# Patient Record
Sex: Female | Born: 1980 | Race: White | Hispanic: No | Marital: Single | Smoking: Current every day smoker
Health system: Southern US, Community
[De-identification: ages and names within clinical notes are randomized; demographics above are authoritative.]

## PROBLEM LIST (undated history)

## (undated) DIAGNOSIS — R531 Weakness: Secondary | ICD-10-CM

## (undated) DIAGNOSIS — F419 Anxiety disorder, unspecified: Secondary | ICD-10-CM

## (undated) DIAGNOSIS — M542 Cervicalgia: Secondary | ICD-10-CM

## (undated) DIAGNOSIS — J45909 Unspecified asthma, uncomplicated: Secondary | ICD-10-CM

## (undated) DIAGNOSIS — Z8709 Personal history of other diseases of the respiratory system: Secondary | ICD-10-CM

## (undated) DIAGNOSIS — J069 Acute upper respiratory infection, unspecified: Secondary | ICD-10-CM

## (undated) DIAGNOSIS — F1090 Alcohol use, unspecified, uncomplicated: Secondary | ICD-10-CM

## (undated) DIAGNOSIS — K219 Gastro-esophageal reflux disease without esophagitis: Secondary | ICD-10-CM

## (undated) DIAGNOSIS — I10 Essential (primary) hypertension: Secondary | ICD-10-CM

## (undated) DIAGNOSIS — F32A Depression, unspecified: Secondary | ICD-10-CM

## (undated) DIAGNOSIS — T7840XA Allergy, unspecified, initial encounter: Secondary | ICD-10-CM

## (undated) DIAGNOSIS — R1115 Cyclical vomiting syndrome unrelated to migraine: Secondary | ICD-10-CM

## (undated) HISTORY — PX: WISDOM TOOTH EXTRACTION: SHX21

## (undated) HISTORY — DX: Allergy, unspecified, initial encounter: T78.40XA

---
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
## 2007-09-30 ENCOUNTER — Emergency Department (HOSPITAL_COMMUNITY): Admission: EM | Admit: 2007-09-30 | Discharge: 2007-09-30 | Payer: Self-pay | Admitting: Family Medicine

## 2012-12-13 ENCOUNTER — Encounter (HOSPITAL_COMMUNITY): Payer: Self-pay | Admitting: *Deleted

## 2012-12-13 ENCOUNTER — Emergency Department (HOSPITAL_COMMUNITY)
Admission: EM | Admit: 2012-12-13 | Discharge: 2012-12-13 | Disposition: A | Discharge status: Home / Self Care | Payer: BC Managed Care – PPO | Attending: Emergency Medicine | Admitting: Emergency Medicine

## 2012-12-13 ENCOUNTER — Emergency Department (HOSPITAL_COMMUNITY): Payer: BC Managed Care – PPO

## 2012-12-13 DIAGNOSIS — R509 Fever, unspecified: Secondary | ICD-10-CM | POA: Insufficient documentation

## 2012-12-13 DIAGNOSIS — R05 Cough: Secondary | ICD-10-CM | POA: Insufficient documentation

## 2012-12-13 DIAGNOSIS — R51 Headache: Secondary | ICD-10-CM | POA: Insufficient documentation

## 2012-12-13 DIAGNOSIS — J45909 Unspecified asthma, uncomplicated: Secondary | ICD-10-CM | POA: Insufficient documentation

## 2012-12-13 DIAGNOSIS — R112 Nausea with vomiting, unspecified: Secondary | ICD-10-CM | POA: Insufficient documentation

## 2012-12-13 DIAGNOSIS — J029 Acute pharyngitis, unspecified: Secondary | ICD-10-CM | POA: Insufficient documentation

## 2012-12-13 DIAGNOSIS — R197 Diarrhea, unspecified: Secondary | ICD-10-CM | POA: Insufficient documentation

## 2012-12-13 DIAGNOSIS — F17200 Nicotine dependence, unspecified, uncomplicated: Secondary | ICD-10-CM | POA: Insufficient documentation

## 2012-12-13 DIAGNOSIS — R6889 Other general symptoms and signs: Secondary | ICD-10-CM

## 2012-12-13 DIAGNOSIS — R059 Cough, unspecified: Secondary | ICD-10-CM | POA: Insufficient documentation

## 2012-12-13 DIAGNOSIS — J3489 Other specified disorders of nose and nasal sinuses: Secondary | ICD-10-CM | POA: Insufficient documentation

## 2012-12-13 DIAGNOSIS — IMO0001 Reserved for inherently not codable concepts without codable children: Secondary | ICD-10-CM | POA: Insufficient documentation

## 2012-12-13 DIAGNOSIS — R61 Generalized hyperhidrosis: Secondary | ICD-10-CM | POA: Insufficient documentation

## 2012-12-13 HISTORY — DX: Unspecified asthma, uncomplicated: J45.909

## 2012-12-13 MED ORDER — SODIUM CHLORIDE 0.9 % IV BOLUS (SEPSIS)
1000.0000 mL | Freq: Once | INTRAVENOUS | Status: AC
  Administered 2012-12-13: 1000 mL via INTRAVENOUS

## 2012-12-13 MED ORDER — ONDANSETRON HCL 4 MG/2ML IJ SOLN
4.0000 mg | Freq: Once | INTRAMUSCULAR | Status: AC
  Administered 2012-12-13: 4 mg via INTRAVENOUS
  Filled 2012-12-13: qty 2

## 2012-12-13 MED ORDER — OSELTAMIVIR PHOSPHATE 75 MG PO CAPS: 75.0000 mg | ORAL_CAPSULE | Freq: Two times a day (BID) | ORAL | Status: DC

## 2012-12-13 MED ORDER — ONDANSETRON HCL 4 MG PO TABS: 4.0000 mg | ORAL_TABLET | Freq: Three times a day (TID) | ORAL | Status: DC | PRN

## 2012-12-13 NOTE — ED Provider Notes (Signed)
History   This chart was scribed for non-physician practitioner working with Meagan Mason. Meagan Payor, MD by Meagan Mason, ED Scribe. This patient was seen in room TR05C/TR05C and the patient's care was started at 9:50 PM.    CSN: [id number]  Arrival date & time 12/13/12  2101   First MD Initiated Contact with Patient 12/13/12 2117      Chief Complaint  Patient presents with  . Flu like symptoms      The history is provided by the patient. No language interpreter was used.   Meagan Mason is a 32 y.o. female with h/o asthma who presents to the Emergency Department complaining of 4 days of dry cough with 2 days of associated myalgias, sore throat, sinus pressure, chills, fever as high as 99.9 while taking medicine, nausea, non-bloody, non-bilious emesis, and non-bloody diarrhea.  Pt states cough has not been improved by Delsym.  Pt is a current everyday smoker and reports alcohol use.   Past Medical History  Diagnosis Date  . Asthma     Past Surgical History  Procedure Date  . Wisdom tooth extraction     History reviewed. No pertinent family history.  History  Substance Use Topics  . Smoking status: Current Every Day Smoker -- 0.5 packs/day  . Smokeless tobacco: Not on file  . Alcohol Use: Yes    No OB history provided.   Review of Systems  Constitutional: Positive for fever, chills and diaphoresis.  HENT: Positive for congestion and sinus pressure.   Respiratory: Positive for cough.   Gastrointestinal: Positive for nausea, vomiting and diarrhea. Negative for abdominal pain.    Allergies  Other  Home Medications  No current outpatient prescriptions on file.  BP 137/81  Pulse 112  Temp 99 F (37.2 C) (Oral)  Resp 22  SpO2 99%  LMP 11/27/2012  Physical Exam  Nursing note and vitals reviewed. Constitutional: She is oriented to person, place, and time. She appears well-developed and well-nourished. No distress.  HENT:  Head: Normocephalic and atraumatic.  Eyes:  EOM are normal.  Neck: Neck supple. No tracheal deviation present.  Cardiovascular: Regular rhythm and normal heart sounds.   No murmur heard.      Tachycardic.  Pulmonary/Chest: Effort normal and breath sounds normal. No respiratory distress. She has no wheezes.  Abdominal: She exhibits no distension.  Musculoskeletal: Normal range of motion.  Neurological: She is alert and oriented to person, place, and time.  Skin: Skin is warm and dry.  Psychiatric: She has a normal mood and affect. Her behavior is normal.    ED Course  Procedures (including critical care time) DIAGNOSTIC STUDIES: Oxygen Saturation is 99% on room air, normal by my interpretation.    COORDINATION OF CARE: 9:57 PM- Patient informed of clinical course, understands medical decision-making process, and agrees with plan.  Ordered IV fluids, IV Zofran, and chest XR.   Dg Chest 2 View  12/13/2012  *RADIOLOGY REPORT*  Clinical Data: Cough and body aches.  CHEST - 2 VIEW  Comparison: None.  Findings: Lungs clear.  Heart size normal.  No pneumothorax or pleural fluid.  Mild S-shaped scoliosis noted.  IMPRESSION: No acute disease.   Original Report Authenticated By: Holley Dexter, M.D.      1. Flu-like symptoms       MDM  Patient given Zofran and IV fluids. Patient feels moderately improved, x-ray of chest shows no acute process, will discharge the patient to home with specific return precautions. Will give the patient  Tamiflu and Zofran, and recommended Robitussin with codeine cough syrup, which can be obtained at the local pharmacy. Recommended hydrating with water/Gatorade mix. Patient understands and agrees with the plan. She is stable and ready for discharge.  Filed Vitals:   12/13/12 2315  BP: 127/77  Pulse: 92  Temp: 97.8 F (36.6 C)  Resp: 19     I personally performed the services described in this documentation, which was scribed in my presence. The recorded information has been reviewed and is  accurate.         Meagan Horseman, PA-C 12/13/12 2318

## 2012-12-13 NOTE — ED Notes (Signed)
Also c/o n/v/d.

## 2012-12-13 NOTE — ED Notes (Signed)
Pt c/o body aches, sore throat, cough, HA, and fever x 2 days.

## 2012-12-13 NOTE — ED Provider Notes (Signed)
Medical screening examination/treatment/procedure(s) were performed by non-physician practitioner and as supervising physician I was immediately available for consultation/collaboration.  Takisha Pelle R. Tyeisha Dinan, MD 12/13/12 2335 

## 2013-12-06 DIAGNOSIS — R531 Weakness: Secondary | ICD-10-CM

## 2013-12-06 DIAGNOSIS — M542 Cervicalgia: Secondary | ICD-10-CM

## 2013-12-06 HISTORY — DX: Cervicalgia: M54.2

## 2013-12-06 HISTORY — DX: Weakness: R53.1

## 2014-11-18 ENCOUNTER — Other Ambulatory Visit (HOSPITAL_COMMUNITY): Payer: Self-pay | Admitting: Neurosurgery

## 2014-12-03 ENCOUNTER — Encounter (HOSPITAL_COMMUNITY): Payer: Self-pay

## 2014-12-03 ENCOUNTER — Encounter (HOSPITAL_COMMUNITY)
Admission: RE | Admit: 2014-12-03 | Discharge: 2014-12-03 | Disposition: A | Discharge status: Home / Self Care | Payer: BC Managed Care – PPO | Source: Ambulatory Visit | Attending: Neurosurgery | Admitting: Neurosurgery

## 2014-12-03 DIAGNOSIS — Z01812 Encounter for preprocedural laboratory examination: Secondary | ICD-10-CM | POA: Insufficient documentation

## 2014-12-03 HISTORY — DX: Personal history of other diseases of the respiratory system: Z87.09

## 2014-12-03 HISTORY — DX: Acute upper respiratory infection, unspecified: J06.9

## 2014-12-03 HISTORY — DX: Cervicalgia: M54.2

## 2014-12-03 HISTORY — DX: Weakness: R53.1

## 2014-12-03 LAB — CBC
HEMATOCRIT: 41.4 % (ref 36.0–46.0)
HEMOGLOBIN: 13.8 g/dL (ref 12.0–15.0)
MCH: 32.5 pg (ref 26.0–34.0)
MCHC: 33.3 g/dL (ref 30.0–36.0)
MCV: 97.4 fL (ref 78.0–100.0)
Platelets: 250 10*3/uL (ref 150–400)
RBC: 4.25 MIL/uL (ref 3.87–5.11)
RDW: 12.9 % (ref 11.5–15.5)
WBC: 6.5 10*3/uL (ref 4.0–10.5)

## 2014-12-03 LAB — BASIC METABOLIC PANEL
ANION GAP: 7 (ref 5–15)
BUN: 15 mg/dL (ref 6–23)
CO2: 25 mmol/L (ref 19–32)
Calcium: 8.9 mg/dL (ref 8.4–10.5)
Chloride: 105 mEq/L (ref 96–112)
Creatinine, Ser: 0.6 mg/dL (ref 0.50–1.10)
GFR calc non Af Amer: >90 mL/min (ref >90)
GLUCOSE: 94 mg/dL (ref 70–99)
Potassium: 4.4 mmol/L (ref 3.5–5.1)
Sodium: 137 mmol/L (ref 135–145)

## 2014-12-03 LAB — SURGICAL PCR SCREEN
MRSA, PCR: NEGATIVE
Staphylococcus aureus: NEGATIVE

## 2014-12-03 LAB — HCG, SERUM, QUALITATIVE: Preg, Serum: NEGATIVE

## 2014-12-03 NOTE — Pre-Procedure Instructions (Signed)
Meagan Mason  12/03/2014   Your procedure is scheduled on:  Fri, Jan 8 @ 12:30 PM  [facility]  at 10:30 AM.  Call this number if you have problems the morning of surgery: [phone]   Remember:   Do not eat food or drink liquids after midnight.   Take these medicines the morning of surgery with A SIP OF WATER: Albuterol<Bring Your Inhaler With You> and Zofran(Ondansetron-if needed)                Stop taking your Sudafed,Dayquil,and Aleve. No Goody's,BC's,Aspirin,Ibuprofen,Fish Oil,or any Herbal Medications   Do not wear jewelry, make-up or nail polish.  Do not wear lotions, powders, or perfumes. You may wear deodorant.  Do not shave 48 hours prior to surgery.   Do not bring valuables to the hospital.  [facility]                  for any belongings or valuables.               Contacts, dentures or bridgework may not be worn into surgery.  Leave suitcase in the car. After surgery it may be brought to your room.  For patients admitted to the hospital, discharge time is determined by your                treatment team.               Patients discharged the day of surgery will not be allowed to drive  home.    Special Instructions:  [place] - Preparing for Surgery  Before surgery, you can play an important role.  Because skin is not sterile, your skin needs to be as free of germs as possible.  You can reduce the number of germs on you skin by washing with CHG (chlorahexidine gluconate) soap before surgery.  CHG is an antiseptic cleaner which kills germs and bonds with the skin to continue killing germs even after washing.  Please DO NOT use if you have an allergy to CHG or antibacterial soaps.  If your skin becomes reddened/irritated stop using the CHG and inform your nurse when you arrive at Short Stay.  Do not shave (including legs and underarms) for at least 48 hours prior to the first CHG shower.  You may shave your face.  Please  follow these instructions carefully:   1.  Shower with CHG Soap the night before surgery and the                                morning of Surgery.  2.  If you choose to wash your hair, wash your hair first as usual with your       normal shampoo.  3.  After you shampoo, rinse your hair and body thoroughly to remove the                      Shampoo.  4.  Use CHG as you would any other liquid soap.  You can apply chg directly       to the skin and wash gently with scrungie or a clean washcloth.  5.  Apply the CHG Soap to your body ONLY FROM THE NECK DOWN.        Do not use on open wounds or open sores.  Avoid contact with your eyes,  ears, mouth and genitals (private parts).  Wash genitals (private parts)       with your normal soap.  6.  Wash thoroughly, paying special attention to the area where your surgery        will be performed.  7.  Thoroughly rinse your body with warm water from the neck down.  8.  DO NOT shower/wash with your normal soap after using and rinsing off       the CHG Soap.  9.  Pat yourself dry with a clean towel.            10.  Wear clean pajamas.            11.  Place clean sheets on your bed the night of your first shower and do not        sleep with pets.  Day of Surgery  Do not apply any lotions/deoderants the morning of surgery.  Please wear clean clothes to the hospital/surgery center.     Please read over the following fact sheets that you were given: Pain Booklet, Coughing and Deep Breathing, MRSA Information and Surgical Site Infection Prevention

## 2014-12-03 NOTE — Progress Notes (Signed)
Pt doesn't have a cardiologist  Denies ever having an echo,stress test,heart cath  Pt doesn't have a medical md  Denies EKG or CXR in past yr

## 2014-12-13 ENCOUNTER — Ambulatory Visit (HOSPITAL_COMMUNITY): Admission: RE | Admit: 2014-12-13 | Payer: BC Managed Care – PPO | Source: Ambulatory Visit | Admitting: Neurosurgery

## 2014-12-13 ENCOUNTER — Encounter (HOSPITAL_COMMUNITY): Admission: RE | Payer: Self-pay | Source: Ambulatory Visit

## 2014-12-13 SURGERY — ANTERIOR CERVICAL DECOMPRESSION/DISCECTOMY FUSION 1 LEVEL
Anesthesia: General

## 2015-07-22 ENCOUNTER — Emergency Department (HOSPITAL_COMMUNITY)
Admission: EM | Admit: 2015-07-22 | Discharge: 2015-07-22 | Disposition: A | Discharge status: Home / Self Care | Payer: BLUE CROSS/BLUE SHIELD | Attending: Emergency Medicine | Admitting: Emergency Medicine

## 2015-07-22 ENCOUNTER — Encounter (HOSPITAL_COMMUNITY): Payer: Self-pay | Admitting: Emergency Medicine

## 2015-07-22 DIAGNOSIS — Z79899 Other long term (current) drug therapy: Secondary | ICD-10-CM | POA: Diagnosis not present

## 2015-07-22 DIAGNOSIS — S0501XA Injury of conjunctiva and corneal abrasion without foreign body, right eye, initial encounter: Secondary | ICD-10-CM | POA: Diagnosis not present

## 2015-07-22 DIAGNOSIS — X19XXXA Contact with other heat and hot substances, initial encounter: Secondary | ICD-10-CM | POA: Insufficient documentation

## 2015-07-22 DIAGNOSIS — Y9289 Other specified places as the place of occurrence of the external cause: Secondary | ICD-10-CM | POA: Insufficient documentation

## 2015-07-22 DIAGNOSIS — T2611XA Burn of cornea and conjunctival sac, right eye, initial encounter: Secondary | ICD-10-CM | POA: Insufficient documentation

## 2015-07-22 DIAGNOSIS — J45909 Unspecified asthma, uncomplicated: Secondary | ICD-10-CM | POA: Insufficient documentation

## 2015-07-22 DIAGNOSIS — Z720 Tobacco use: Secondary | ICD-10-CM | POA: Diagnosis not present

## 2015-07-22 DIAGNOSIS — Y9389 Activity, other specified: Secondary | ICD-10-CM | POA: Diagnosis not present

## 2015-07-22 DIAGNOSIS — Y998 Other external cause status: Secondary | ICD-10-CM | POA: Insufficient documentation

## 2015-07-22 MED ORDER — ERYTHROMYCIN 5 MG/GM OP OINT: TOPICAL_OINTMENT | OPHTHALMIC | Status: DC

## 2015-07-22 MED ORDER — HYDROCODONE-ACETAMINOPHEN 5-325 MG PO TABS: 1.0000 | ORAL_TABLET | ORAL | Status: DC | PRN

## 2015-07-22 MED ORDER — TETRACAINE HCL 0.5 % OP SOLN
2.0000 [drp] | Freq: Once | OPHTHALMIC | Status: AC
  Administered 2015-07-22: 2 [drp] via OPHTHALMIC
  Filled 2015-07-22: qty 2
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
## 2015-07-22 NOTE — Discharge Instructions (Signed)
Corneal Abrasion °The cornea is the clear covering at the front and center of the eye. When looking at the colored portion of the eye (iris), you are looking through the cornea. This very thin tissue is made up of many layers. The surface layer is a single layer of cells (corneal epithelium) and is one of the most sensitive tissues in the body. If a scratch or injury causes the corneal epithelium to come off, it is called a corneal abrasion. If the injury extends to the tissues below the epithelium, the condition is called a corneal ulcer. °CAUSES  °· Scratches. °· Trauma. °· Foreign body in the eye. °Some people have recurrences of abrasions in the area of the original injury even after it has healed (recurrent erosion syndrome). Recurrent erosion syndrome generally improves and goes away with time. °SYMPTOMS  °· Eye pain. °· Difficulty or inability to keep the injured eye open. °· The eye becomes very sensitive to light. °· Recurrent erosions tend to happen suddenly, first thing in the morning, usually after waking up and opening the eye. °DIAGNOSIS  °Your health care provider can diagnose a corneal abrasion during an eye exam. Dye is usually placed in the eye using a drop or a small paper strip moistened by your tears. When the eye is examined with a special light, the abrasion shows up clearly because of the dye. °TREATMENT  °· Small abrasions may be treated with antibiotic drops or ointment alone. °· A pressure patch may be put over the eye. If this is done, follow your doctor's instructions for when to remove the patch. Do not drive or use machines while the eye patch is on. Judging distances is hard to do with a patch on. °If the abrasion becomes infected and spreads to the deeper tissues of the cornea, a corneal ulcer can result. This is serious because it can cause corneal scarring. Corneal scars interfere with light passing through the cornea and cause a loss of vision in the involved eye. °HOME CARE  INSTRUCTIONS °· Use medicine or ointment as directed. Only take over-the-counter or prescription medicines for pain, discomfort, or fever as directed by your health care provider. °· Do not drive or operate machinery if your eye is patched. Your ability to judge distances is impaired. °· If your health care provider has given you a follow-up appointment, it is very important to keep that appointment. Not keeping the appointment could result in a severe eye infection or permanent loss of vision. If there is any problem keeping the appointment, let your health care provider know. °SEEK MEDICAL CARE IF:  °· You have pain, light sensitivity, and a scratchy feeling in one eye or both eyes. °· Your pressure patch keeps loosening up, and you can blink your eye under the patch after treatment. °· Any kind of discharge develops from the eye after treatment or if the lids stick together in the morning. °· You have the same symptoms in the morning as you did with the original abrasion days, weeks, or months after the abrasion healed. °MAKE SURE YOU:  °· Understand these instructions. °· Will watch your condition. °· Will get help right away if you are not doing well or get worse. °Document Released: 11/19/2000 Document Revised: 11/27/2013 Document Reviewed: 07/30/2013 °ExitCare® Patient Information ©2015 [employer]. This information is not intended to replace advice given to you by your health care provider. Make sure you discuss any questions you have with your health care provider. ° °

## 2015-10-16 ENCOUNTER — Emergency Department (HOSPITAL_COMMUNITY)
Admission: EM | Admit: 2015-10-16 | Discharge: 2015-10-16 | Disposition: A | Discharge status: Home / Self Care | Payer: BLUE CROSS/BLUE SHIELD | Source: Home / Self Care | Attending: Family Medicine | Admitting: Family Medicine

## 2015-10-16 ENCOUNTER — Encounter (HOSPITAL_COMMUNITY): Payer: Self-pay | Admitting: Emergency Medicine

## 2015-10-16 DIAGNOSIS — H0015 Chalazion left lower eyelid: Secondary | ICD-10-CM

## 2015-10-16 NOTE — ED Notes (Signed)
Patient reports a stye for a week.  Patient has had sinus issues for a month, worsened recently.  Sinus symptoms: sneezing, coughing, sinus drainage, headache, head and ears feel pressurized.  Patient's left eye has yellow/brown color to area under eye, an area below that is darker in pigmentation than rest of skin.  Patient does not have any explanation for any of the coloration.

## 2015-10-16 NOTE — ED Provider Notes (Signed)
CSN: [id number]     Arrival date & time 10/16/15  1414 History   First MD Initiated Contact with Patient 10/16/15 1548     Chief Complaint  Patient presents with  . Eye Problem   (Consider location/radiation/quality/duration/timing/severity/associated sxs/prior Treatment) HPI Comments: 34 year old female is having sinus symptoms for 1 month. She states this is seasonal occurs approximately every year. She is currently complaining of a "stye" to the left eye for one week. She is complaining of mild redness to the lower lid with a nodular lesion within the left lower lid. Overnight there is some drainage which met [place] by the morning. She is applying warm compresses 3 times a day. It is mildly sore and tender. She was concerned about today's change in color to the skin below the left lower eye. It appears as if it is a black eye. There is a slight yellowish brown discoloration in the dependent areas beneath the left lower lid. No erythema. No tenderness over the discoloration. No periorbital tenderness, pain or erythema. Vision is unchanged.   Past Medical History  Diagnosis Date  . Complication of anesthesia     crying after anesthesia  . Asthma     states only when stressed out  . URI (upper respiratory infection)     early Nov 2014  . History of bronchitis     in college  . Weakness     numbness in left arm  . Neck pain     ruptured disc in neck   Past Surgical History  Procedure Laterality Date  . Wisdom tooth extraction     No family history on file. Social History  Substance Use Topics  . Smoking status: Current Every Day Smoker -- 0.25 packs/day for 10 years    Types: Cigarettes  . Smokeless tobacco: None  . Alcohol Use: Yes     Comment: wine 2-3 times a week   OB History    No data available     Review of Systems  Constitutional: Negative for fever, activity change and fatigue.  HENT: Positive for congestion, postnasal drip and rhinorrhea. Negative for sore throat.    Eyes: Positive for discharge, redness and itching. Negative for photophobia and visual disturbance.  Respiratory: Negative.   Skin: Negative.     Allergies  Other  Home Medications   Prior to Admission medications   Medication Sig Start Date End Date Taking? Authorizing Provider  albuterol (PROVENTIL HFA;VENTOLIN HFA) 108 (90 BASE) MCG/ACT inhaler Inhale 2 puffs into the lungs every 6 (six) hours as needed for wheezing or shortness of breath.     Historical Provider, MD  erythromycin ophthalmic ointment Place a 1/2 inch ribbon of ointment into the lower eyelid twice daily. 07/22/15   Leo Grosser, MD  HYDROcodone-acetaminophen (NORCO/VICODIN) 5-325 MG per tablet Take 1 tablet by mouth every 4 (four) hours as needed. 07/22/15   Leo Grosser, MD  naproxen sodium (ANAPROX) 220 MG tablet Take 220-440 mg by mouth 3 (three) times daily as needed (pain).     Historical Provider, MD  norethindrone-ethinyl estradiol (JUNEL FE,GILDESS FE,LOESTRIN FE) 1-20 MG-MCG tablet Take 1 tablet by mouth daily.    Historical Provider, MD   Meds Ordered and Administered this Visit  Medications - No data to display  BP 150/99 mmHg  Pulse 82  Temp(Src) 98 F (36.7 C) (Oral)  Resp 16  SpO2 100% No data found.   Physical Exam  Constitutional: She is oriented to person, place, and time. She appears  well-developed and well-nourished. No distress.  Eyes: EOM are normal. Pupils are equal, round, and reactive to light.  The eye proper appears intact. Sclerae clear. Full EOMI. Anterior chamber is clear. No hyphema. The lower lid conjunctiva is erythematous with minor swelling. There is a nodular lesion, mildly tender within the lower eyelid, consistent with chalazion. No current drainage.  There is a light brown/tan discoloration below to the left lower eyelid. It has an apparent similar to a black eye. There is no bluish affect. No erythema. The face and maxilla over the discoloration are not tender. There is no  orbital tenderness or erythema.   Neck: Normal range of motion. Neck supple.  Cardiovascular: Normal rate, regular rhythm and normal heart sounds.   Pulmonary/Chest: Effort normal and breath sounds normal. No respiratory distress.  Neurological: She is alert and oriented to person, place, and time. She exhibits normal muscle tone.  Skin: Skin is warm.  Nursing note and vitals reviewed.   ED Course  Procedures (including critical care time)  Labs Review Labs Reviewed - No data to display  Imaging Review No results found.   Visual Acuity Review  Right Eye Distance:   Left Eye Distance:   Bilateral Distance:    Right Eye Near:   Left Eye Near:    Bilateral Near:         MDM   1. Chalazion of left lower eyelid    Continue to use warm compresses. For any worsening, new symptoms or problems call the ophthalmologist on page 1.    Janne Napoleon, NP 10/16/15 [phone]

## 2015-10-16 NOTE — Discharge Instructions (Signed)
Chalazion A chalazion is a swelling or lump on the eyelid. It can affect the upper or lower eyelid. CAUSES This condition may be caused by:  Long-lasting (chronic) inflammation of the eyelid glands.  A blocked oil gland in the eyelid. SYMPTOMS Symptoms of this condition include:  A swelling on the eyelid. The swelling may spread to areas around the eye.  A hard lump on the eyelid. This lump may make it hard to see out of the eye. DIAGNOSIS This condition is diagnosed with an examination of the eye. TREATMENT This condition is treated by applying a warm compress to the eyelid. If the condition does not improve after two days, it may be treated with:  Surgery.  Medicine that is injected into the chalazion by a health care provider.  Medicine that is applied to the eye. HOME CARE INSTRUCTIONS  Do not touch the chalazion.  Do not try to remove the pus, such as by squeezing the chalazion or sticking it with a pin or needle.  Do not rub your eyes.  Wash your hands often. Dry your hands with a clean towel.  Keep your face, scalp, and eyebrows clean.  Avoid wearing eye makeup.  Apply a warm, moist compress to the eyelid 4-6 times a day for 10-15 minutes at a time. This will help to open any blocked glands and help to reduce redness and swelling.  Apply over-the-counter and prescription medicines only as told by your health care provider.  If the chalazion does not break open (rupture) on its own in a month, return to your health care provider.  Keep all follow-up appointments as told by your health care provider. This is important. SEEK MEDICAL CARE IF:  Your eyelid has not improved in 4 weeks.  Your eyelid is getting worse.  You have a fever.  The chalazion does not rupture on its own with home treatment in a month. SEEK IMMEDIATE MEDICAL CARE IF:  You have pain in your eye.  Your vision changes.  The chalazion becomes painful or red  The chalazion gets  bigger.   This information is not intended to replace advice given to you by your health care provider. Make sure you discuss any questions you have with your health care provider.   Document Released: 11/19/2000 Document Revised: 08/13/2015 Document Reviewed: 03/17/2015 Elsevier Interactive Patient Education 2016 [employer]  

## 2016-02-04 ENCOUNTER — Emergency Department (HOSPITAL_COMMUNITY)
Admission: EM | Admit: 2016-02-04 | Discharge: 2016-02-04 | Disposition: A | Discharge status: Home / Self Care | Payer: BLUE CROSS/BLUE SHIELD | Attending: Emergency Medicine | Admitting: Emergency Medicine

## 2016-02-04 ENCOUNTER — Encounter (HOSPITAL_COMMUNITY): Payer: Self-pay | Admitting: Emergency Medicine

## 2016-02-04 DIAGNOSIS — Z3202 Encounter for pregnancy test, result negative: Secondary | ICD-10-CM | POA: Insufficient documentation

## 2016-02-04 DIAGNOSIS — J45909 Unspecified asthma, uncomplicated: Secondary | ICD-10-CM | POA: Insufficient documentation

## 2016-02-04 DIAGNOSIS — Z79899 Other long term (current) drug therapy: Secondary | ICD-10-CM | POA: Insufficient documentation

## 2016-02-04 DIAGNOSIS — J039 Acute tonsillitis, unspecified: Secondary | ICD-10-CM | POA: Insufficient documentation

## 2016-02-04 DIAGNOSIS — F17210 Nicotine dependence, cigarettes, uncomplicated: Secondary | ICD-10-CM | POA: Insufficient documentation

## 2016-02-04 DIAGNOSIS — R112 Nausea with vomiting, unspecified: Secondary | ICD-10-CM | POA: Diagnosis present

## 2016-02-04 DIAGNOSIS — Z793 Long term (current) use of hormonal contraceptives: Secondary | ICD-10-CM | POA: Insufficient documentation

## 2016-02-04 LAB — COMPREHENSIVE METABOLIC PANEL
ALBUMIN: 5.1 g/dL — AB (ref 3.5–5.0)
ALK PHOS: 54 U/L (ref 38–126)
ALT: 36 U/L (ref 14–54)
AST: 33 U/L (ref 15–41)
Anion gap: 19 — ABNORMAL HIGH (ref 5–15)
BILIRUBIN TOTAL: 1.7 mg/dL — AB (ref 0.3–1.2)
BUN: 12 mg/dL (ref 6–20)
CALCIUM: 10 mg/dL (ref 8.9–10.3)
CO2: 18 mmol/L — ABNORMAL LOW (ref 22–32)
Chloride: 99 mmol/L — ABNORMAL LOW (ref 101–111)
Creatinine, Ser: 0.89 mg/dL (ref 0.44–1.00)
GFR calc Af Amer: >60 mL/min (ref >60)
GLUCOSE: 115 mg/dL — AB (ref 65–99)
Potassium: 3.5 mmol/L (ref 3.5–5.1)
Sodium: 136 mmol/L (ref 135–145)
TOTAL PROTEIN: 8.2 g/dL — AB (ref 6.5–8.1)

## 2016-02-04 LAB — CBC
HCT: 43.4 % (ref 36.0–46.0)
Hemoglobin: 15.6 g/dL — ABNORMAL HIGH (ref 12.0–15.0)
MCH: 33.4 pg (ref 26.0–34.0)
MCHC: 35.9 g/dL (ref 30.0–36.0)
MCV: 92.9 fL (ref 78.0–100.0)
PLATELETS: 311 10*3/uL (ref 150–400)
RBC: 4.67 MIL/uL (ref 3.87–5.11)
RDW: 12.6 % (ref 11.5–15.5)
WBC: 15 10*3/uL — AB (ref 4.0–10.5)

## 2016-02-04 LAB — URINE MICROSCOPIC-ADD ON

## 2016-02-04 LAB — URINALYSIS, ROUTINE W REFLEX MICROSCOPIC
BILIRUBIN URINE: NEGATIVE
Glucose, UA: NEGATIVE mg/dL
Ketones, ur: >80 mg/dL — AB
LEUKOCYTES UA: NEGATIVE
NITRITE: NEGATIVE
PH: 5.5 (ref 5.0–8.0)
Protein, ur: 30 mg/dL — AB
SPECIFIC GRAVITY, URINE: 1.025 (ref 1.005–1.030)

## 2016-02-04 LAB — I-STAT BETA HCG BLOOD, ED (MC, WL, AP ONLY): I-stat hCG, quantitative: <5 m[IU]/mL (ref <5)

## 2016-02-04 LAB — RAPID STREP SCREEN (MED CTR MEBANE ONLY): Streptococcus, Group A Screen (Direct): NEGATIVE

## 2016-02-04 LAB — LIPASE, BLOOD: Lipase: 28 U/L (ref 11–51)

## 2016-02-04 LAB — I-STAT CG4 LACTIC ACID, ED: Lactic Acid, Venous: 0.76 mmol/L (ref 0.5–2.0)

## 2016-02-04 MED ORDER — ONDANSETRON 4 MG PO TBDP
4.0000 mg | ORAL_TABLET | Freq: Once | ORAL | Status: AC | PRN
  Administered 2016-02-04: 4 mg via ORAL
  Filled 2016-02-04: qty 1

## 2016-02-04 MED ORDER — PANTOPRAZOLE SODIUM 40 MG IV SOLR
40.0000 mg | Freq: Once | INTRAVENOUS | Status: AC
  Administered 2016-02-04: 40 mg via INTRAVENOUS
  Filled 2016-02-04: qty 40

## 2016-02-04 MED ORDER — PROMETHAZINE HCL 25 MG PO TABS: 25.0000 mg | ORAL_TABLET | Freq: Four times a day (QID) | ORAL | Status: DC | PRN

## 2016-02-04 MED ORDER — SODIUM CHLORIDE 0.9 % IV BOLUS (SEPSIS)
1000.0000 mL | Freq: Once | INTRAVENOUS | Status: AC
  Administered 2016-02-04: 1000 mL via INTRAVENOUS

## 2016-02-04 MED ORDER — PANTOPRAZOLE SODIUM 40 MG PO TBEC: 40.0000 mg | DELAYED_RELEASE_TABLET | Freq: Every day | ORAL | Status: DC

## 2016-02-04 MED ORDER — PROMETHAZINE HCL 25 MG/ML IJ SOLN
25.0000 mg | Freq: Once | INTRAMUSCULAR | Status: AC
  Administered 2016-02-04: 25 mg via INTRAVENOUS
  Filled 2016-02-04: qty 1

## 2016-02-04 NOTE — Discharge Instructions (Signed)
Meagan Mason,  Nice meeting you! Please follow-up with your primary care provider. Return to the emergency department if you develop abdominal pain, are unable to keep foods down, have chest pain, shortness of breath. Feel better soon!  S. Lane Hacker, PA-C  Nausea and Vomiting Nausea is a sick feeling that often comes before throwing up (vomiting). Vomiting is a reflex where stomach contents come out of your mouth. Vomiting can cause severe loss of body fluids (dehydration). Children and elderly adults can become dehydrated quickly, especially if they also have diarrhea. Nausea and vomiting are symptoms of a condition or disease. It is important to find the cause of your symptoms. CAUSES   Direct irritation of the stomach lining. This irritation can result from increased acid production (gastroesophageal reflux disease), infection, food poisoning, taking certain medicines (such as nonsteroidal anti-inflammatory drugs), alcohol use, or tobacco use.  Signals from the brain.These signals could be caused by a headache, heat exposure, an inner ear disturbance, increased pressure in the brain from injury, infection, a tumor, or a concussion, pain, emotional stimulus, or metabolic problems.  An obstruction in the gastrointestinal tract (bowel obstruction).  Illnesses such as diabetes, hepatitis, gallbladder problems, appendicitis, kidney problems, cancer, sepsis, atypical symptoms of a heart attack, or eating disorders.  Medical treatments such as chemotherapy and radiation.  Receiving medicine that makes you sleep (general anesthetic) during surgery. DIAGNOSIS Your caregiver may ask for tests to be done if the problems do not improve after a few days. Tests may also be done if symptoms are severe or if the reason for the nausea and vomiting is not clear. Tests may include:  Urine tests.  Blood tests.  Stool tests.  Cultures (to look for evidence of infection).  X-rays or other  imaging studies. Test results can help your caregiver make decisions about treatment or the need for additional tests. TREATMENT You need to stay well hydrated. Drink frequently but in small amounts.You may wish to drink water, sports drinks, clear broth, or eat frozen ice pops or gelatin dessert to help stay hydrated.When you eat, eating slowly may help prevent nausea.There are also some antinausea medicines that may help prevent nausea. HOME CARE INSTRUCTIONS   Take all medicine as directed by your caregiver.  If you do not have an appetite, do not force yourself to eat. However, you must continue to drink fluids.  If you have an appetite, eat a normal diet unless your caregiver tells you differently.  Eat a variety of complex carbohydrates (rice, wheat, potatoes, bread), lean meats, yogurt, fruits, and vegetables.  Avoid high-fat foods because they are more difficult to digest.  Drink enough water and fluids to keep your urine clear or pale yellow.  If you are dehydrated, ask your caregiver for specific rehydration instructions. Signs of dehydration may include:  Severe thirst.  Dry lips and mouth.  Dizziness.  Dark urine.  Decreasing urine frequency and amount.  Confusion.  Rapid breathing or pulse. SEEK IMMEDIATE MEDICAL CARE IF:   You have blood or brown flecks (like coffee grounds) in your vomit.  You have black or bloody stools.  You have a severe headache or stiff neck.  You are confused.  You have severe abdominal pain.  You have chest pain or trouble breathing.  You do not urinate at least once every 8 hours.  You develop cold or clammy skin.  You continue to vomit for longer than 24 to 48 hours.  You have a fever. MAKE SURE  YOU:   Understand these instructions.  Will watch your condition.  Will get help right away if you are not doing well or get worse.   This information is not intended to replace advice given to you by your health care  provider. Make sure you discuss any questions you have with your health care provider.   Document Released: 11/22/2005 Document Revised: 02/14/2012 Document Reviewed: 04/21/2011 Elsevier Interactive Patient Education [employer]

## 2016-02-04 NOTE — ED Notes (Signed)
Bed: WTR7 Expected date:  Expected time:  Means of arrival:  Comments: phlebo

## 2016-02-04 NOTE — ED Notes (Signed)
RN will draw from IV line for I stat lactic acid

## 2016-02-04 NOTE — ED Notes (Signed)
Pt's mother reported to Registration that the Pt has vomited "12 more times."

## 2016-02-04 NOTE — ED Notes (Signed)
Pt reports emesis x 5 days. denies abd pain nor diarrhea. Pt believes this is related to her stress or anxiety, reports also having acid reflex yet not taking meds for it. Alert and oriented x 4.

## 2016-02-04 NOTE — ED Provider Notes (Signed)
CSN: [id number]     Arrival date & time 02/04/16  1603 History   None    Chief Complaint  Patient presents with  . Emesis   HPI  Meagan Mason is a 35 y.o. female PMH significant for asthma presenting with a 5 day history of nausea, vomiting. She states she believes this is related to her stress or anxiety, and she has had a lot of that recently. She endorses a history of acid reflux but does not take medication for this. She has had a few episodes of diarrhea within the last 5 days. She denies fevers, chills, headache, chest pain, shortness of breath, abdominal pain, urinary complaints, hematochezia.  Past Medical History  Diagnosis Date  . Complication of anesthesia     crying after anesthesia  . Asthma     states only when stressed out  . URI (upper respiratory infection)     early Nov 2014  . History of bronchitis     in college  . Weakness     numbness in left arm  . Neck pain     ruptured disc in neck   Past Surgical History  Procedure Laterality Date  . Wisdom tooth extraction     No family history on file. Social History  Substance Use Topics  . Smoking status: Current Every Day Smoker -- 0.25 packs/day for 10 years    Types: Cigarettes  . Smokeless tobacco: None  . Alcohol Use: Yes     Comment: wine 2-3 times a week   OB History    No data available     Review of Systems  Ten systems are reviewed and are negative for acute change except as noted in the HPI   Allergies  Other  Home Medications   Prior to Admission medications   Medication Sig Start Date End Date Taking? Authorizing Provider  albuterol (PROVENTIL HFA;VENTOLIN HFA) 108 (90 BASE) MCG/ACT inhaler Inhale 2 puffs into the lungs every 6 (six) hours as needed for wheezing or shortness of breath.    Yes Historical Provider, MD  naproxen sodium (ANAPROX) 220 MG tablet Take 220-440 mg by mouth 3 (three) times daily as needed (pain).    Yes Historical Provider, MD  erythromycin ophthalmic ointment  Place a 1/2 inch ribbon of ointment into the lower eyelid twice daily. Patient not taking: Reported on 02/04/2016 07/22/15   Lyndal Pulley, MD  HYDROcodone-acetaminophen (NORCO/VICODIN) 5-325 MG per tablet Take 1 tablet by mouth every 4 (four) hours as needed. Patient not taking: Reported on 02/04/2016 07/22/15   Lyndal Pulley, MD  norethindrone-ethinyl estradiol (JUNEL FE,GILDESS FE,LOESTRIN FE) 1-20 MG-MCG tablet Take 1 tablet by mouth daily.    Historical Provider, MD   BP 135/100 mmHg  Pulse 100  Temp(Src) 97.9 F (36.6 C) (Oral)  Resp 20  SpO2 100%  LMP 01/16/2016 Physical Exam  Constitutional: She appears well-developed and well-nourished. No distress.  HENT:  Head: Normocephalic and atraumatic.  Tonsils 2+, erythematous. Right tonsil with exudate vs tonsilith.   Eyes: Conjunctivae are normal. Pupils are equal, round, and reactive to light. Right eye exhibits no discharge. Left eye exhibits no discharge. No scleral icterus.  Neck: No tracheal deviation present.  Cardiovascular: Normal rate, regular rhythm, normal heart sounds and intact distal pulses.  Exam reveals no gallop and no friction rub.   No murmur heard. Pulmonary/Chest: Effort normal and breath sounds normal. No respiratory distress. She has no wheezes. She has no rales. She exhibits no tenderness.  Abdominal: Soft. Bowel sounds are normal. She exhibits no distension and no mass. There is no tenderness. There is no rebound and no guarding.  Musculoskeletal: She exhibits no edema.  Lymphadenopathy:    She has no cervical adenopathy.  Neurological: She is alert. Coordination normal.  Skin: Skin is warm and dry. No rash noted. She is not diaphoretic. No erythema.  Psychiatric: She has a normal mood and affect. Her behavior is normal.  Nursing note and vitals reviewed.   ED Course  Procedures  Labs Review Labs Reviewed  COMPREHENSIVE METABOLIC PANEL - Abnormal; Notable for the following:    Chloride 99 (*)    CO2 18 (*)     Glucose, Bld 115 (*)    Total Protein 8.2 (*)    Albumin 5.1 (*)    Total Bilirubin 1.7 (*)    Anion gap 19 (*)    All other components within normal limits  CBC - Abnormal; Notable for the following:    WBC 15.0 (*)    Hemoglobin 15.6 (*)    All other components within normal limits  RAPID STREP SCREEN (NOT AT [facility])  LIPASE, BLOOD  URINALYSIS, ROUTINE W REFLEX MICROSCOPIC (NOT AT [facility])  I-STAT BETA HCG BLOOD, ED ([facility], [facility], [facility] ONLY)    MDM   Final diagnoses:  Nausea and vomiting, vomiting of unspecified type   Patient with symptoms consistent with viral gastroenteritis vs GERD.  Vitals are stable, no fever.  No signs of dehydration, tolerating PO fluids > 6 oz.  Lungs are clear.  No focal abdominal pain, no concern for appendicitis, cholecystitis, pancreatitis, ruptured viscus, UTI, kidney stone, or any other abdominal etiology.  Supportive therapy indicated with return if symptoms worsen.  Patient counseled. Patient feeling better upon reassessment. Patient may be safely discharged.    Melton Krebs, PA-C 02/07/16 [id number]  Lyndal Pulley, MD 02/07/16 1500

## 2016-02-06 ENCOUNTER — Telehealth: Payer: Self-pay | Admitting: Family Medicine

## 2016-02-06 ENCOUNTER — Ambulatory Visit (INDEPENDENT_AMBULATORY_CARE_PROVIDER_SITE_OTHER): Payer: BLUE CROSS/BLUE SHIELD | Admitting: Family Medicine

## 2016-02-06 ENCOUNTER — Encounter: Payer: Self-pay | Admitting: Family Medicine

## 2016-02-06 VITALS — BP 126/77 | HR 92 | Temp 98.4°F | Resp 20 | Ht 64.0 in | Wt 139.5 lb

## 2016-02-06 DIAGNOSIS — K2970 Gastritis, unspecified, without bleeding: Secondary | ICD-10-CM

## 2016-02-06 DIAGNOSIS — Z0000 Encounter for general adult medical examination without abnormal findings: Secondary | ICD-10-CM

## 2016-02-06 DIAGNOSIS — Z7189 Other specified counseling: Secondary | ICD-10-CM

## 2016-02-06 DIAGNOSIS — Z7689 Persons encountering health services in other specified circumstances: Secondary | ICD-10-CM | POA: Insufficient documentation

## 2016-02-06 DIAGNOSIS — Z23 Encounter for immunization: Secondary | ICD-10-CM | POA: Diagnosis not present

## 2016-02-06 DIAGNOSIS — Z720 Tobacco use: Secondary | ICD-10-CM | POA: Diagnosis not present

## 2016-02-06 LAB — CBC WITH DIFFERENTIAL/PLATELET
BASOS ABS: 0.1 10*3/uL (ref 0.0–0.1)
Basophils Relative: 0.6 % (ref 0.0–3.0)
EOS ABS: 0.1 10*3/uL (ref 0.0–0.7)
Eosinophils Relative: 1.4 % (ref 0.0–5.0)
HEMATOCRIT: 41.8 % (ref 36.0–46.0)
HEMOGLOBIN: 14.3 g/dL (ref 12.0–15.0)
LYMPHS PCT: 30.7 % (ref 12.0–46.0)
Lymphs Abs: 2.8 10*3/uL (ref 0.7–4.0)
MCHC: 34.2 g/dL (ref 30.0–36.0)
MCV: 96.2 fl (ref 78.0–100.0)
Monocytes Absolute: 0.7 10*3/uL (ref 0.1–1.0)
Monocytes Relative: 7.4 % (ref 3.0–12.0)
NEUTROS PCT: 59.9 % (ref 43.0–77.0)
Neutro Abs: 5.4 10*3/uL (ref 1.4–7.7)
Platelets: 236 10*3/uL (ref 150.0–400.0)
RBC: 4.35 Mil/uL (ref 3.87–5.11)
RDW: 13.4 % (ref 11.5–15.5)
WBC: 9 10*3/uL (ref 4.0–10.5)

## 2016-02-06 LAB — H. PYLORI ANTIBODY, IGG: H Pylori IgG: NEGATIVE

## 2016-02-06 NOTE — Telephone Encounter (Signed)
Please call pt: - Labs are normal. No elevated WBC any longer.  - Negative, H. Pylori.

## 2016-02-06 NOTE — Progress Notes (Addendum)
Patient ID: Meagan Mason, female   DOB: 1981-02-03, 35 y.o.   MRN: [id number]      Patient ID: Meagan Mason, female  DOB: 1981/02/25, 35 y.o.   MRN: [id number]  Subjective:  Meagan Mason is a 35 y.o. female present for establishment of care, with acute complaint after emergency room visit. All past medical history, surgical history, allergies, family history, immunizations, medications and social history were obtained in the electronic medical record today. All recent labs, ED visits and hospitalizations within the last year were reviewed.  Emesis: A she was seen in the emergency room 2 days ago for nausea and vomiting. She states that she was noticing some mild nausea and vomiting for about a week. She had a few episodes of diarrhea during that time as well. She went to the emergency room 2 days ago, after having a "violent "episodes of vomiting. She states she vomited at least 12 times after taking a few bites of a quesadilla. He states after about the third bite of the quesadilla she felt the need to vomit however she was out in public with her boyfriend and his family. She excused herself to the bathroom and vomited multiple times. She states on the way home they had to pull over so she could again vomit multiple times. She states initially it was only, and then it was a brown colored liquid, that she felt was secondary to drinking soda. No one else at the people has become ill. She admits the few days prior to this onset she has been more anxious, and not taking time to eat. She reports her boyfriend's study for the bar exam and she is needing to take on more responsibilities. She has not been eating appropriately, and all of the food she name she has been eating has been high fat/grease/spicy.he has a history of reflux without taking medications. He states while in the ED she was reportedly treated with a "drip" and she started to feel improved. She was prescribed Phenergan and a PPI. She states  she has been able to tolerate meals now, and has had no more vomiting since yesterday. She did have an elevated white count in the emergency room, and was told she likely had an infection of some type, per patient report.  Health maintenance:  Colonoscopy: No family history, routine screening at 24. Mammogram: Mammogram screening to start at age 50. As cancer maternal grandmother at age 81. Cervical cancer screening: She reports last Pap smear 2011, needs screening. Immunizations: tdap due, declined influenza vaccination. Infectious disease screening: Unknown screening. Hospitalizations/ED visits: Reviewed  Past Medical History  Diagnosis Date  . Asthma     states only when stressed out  . URI (upper respiratory infection)     early Nov 2014  . History of bronchitis     in college  . Weakness     numbness in left arm  . Neck pain     ruptured disc in neck  . Allergy    Allergies  Allergen Reactions  . Other Shortness Of Breath and Itching    Pecans, tree nuts    Past Surgical History  Procedure Laterality Date  . Wisdom tooth extraction     Family History  Problem Relation Age of Onset  . Heart disease Mother   . Hypertension Mother   . Heart attack Mother 37  . Early death Father   . Cancer Father 1    stomach cancer  . Stroke Father   .  Heart attack Father     After stroke  . Breast cancer Paternal Grandmother [id number]   Social History   Social History  . Marital Status: Single    Spouse Name: N/A  . Number of Children: N/A  . Years of Education: N/A   Occupational History  . Not on file.   Social History Main Topics  . Smoking status: Current Every Day Smoker -- 0.25 packs/day for 10 years    Types: Cigarettes  . Smokeless tobacco: Never Used  . Alcohol Use: Yes     Comment: wine 2-3 times a week  . Drug Use: No  . Sexual Activity: Yes   Other Topics Concern  . Not on file   Social History Narrative   Engaged.   College degree, bachelors. 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   Occasional alcohol. Every day smoker. No drugs.   Drink caffeinated beverages.   Wears her seatbelt and bicycle helmet.   Smoke detector at home.    ROS: Negative, with the exception of above mentioned in HPI  Objective: BP 126/77 mmHg  Pulse 92  Temp(Src) 98.4 F (36.9 C)  Resp 20  Ht 5\' 4"  (1.626 m)  Wt 139 lb 8 oz (63.277 kg)  BMI 23.93 kg/m2  SpO2 98%  LMP 01/16/2016 Gen: Afebrile. No acute distress. Nontoxic in appearance, well-developed, well-nourished, female, Caucasian female, pleasant HENT: AT. [place]. Bilateral TM visualized and normal in appearance, normal external auditory canal. MMM, no oral lesions Eyes:Pupils Equal Round Reactive to light, Extraocular movements intact,  Conjunctiva without redness, discharge or icterus. Neck/lymp/endocrine: Supple, no lymphadenopathy CV: RRR  Chest: CTAB, no wheeze, rhonchi or crackles.  Abd: Soft. Flat. NTND. BS present. No Masses palpated. No hepatosplenomegaly. No rebound tenderness or guarding. Skin: No rashes, purpura or petechiae. Warm and well-perfused. Skin intact. Neuro/Msk: Normal gait. PERLA. EOMi. Alert. Oriented x3.   Psych: Normal affect, dress and demeanor. Normal speech. Normal thought content and judgment.  Assessment/plan: Meagan Mason is a 35 y.o. female present for establishment with emergency room 1. Gastritis - It sounds from history patient either suffers from gastritis her had a episode of food poisoning versus gastroenteritis. - Sounds like she is improving. - Patient is to continue PPI started in the emergency room, follow GERD diet, no NSAIDs. - H. pylori antibody, IgG - CBC w/Diff  2. Need for diphtheria-tetanus-pertussis (Tdap) vaccine, adult/adolescent - Tdap vaccine greater than or equal to 7yo IM  3. Tobacco abuse - We briefly touched base on smoking cessation, will discuss greater than 3 minutes on her next office visit.   Return in about 4 weeks (around  03/05/2016).  Electronically signed by: Felix Pacinienee Gemini Beaumier, DO [facility]

## 2016-02-06 NOTE — Patient Instructions (Signed)
Followup in 4 weeks.  Follow GERD diet/modifications NO NSAIDS.    It was a pleasure meeting you today.   Gastritis, Adult Gastritis is soreness and swelling (inflammation) of the lining of the stomach. Gastritis can develop as a sudden onset (acute) or long-term (chronic) condition. If gastritis is not treated, it can lead to stomach bleeding and ulcers. CAUSES  Gastritis occurs when the stomach lining is weak or damaged. Digestive juices from the stomach then inflame the weakened stomach lining. The stomach lining may be weak or damaged due to viral or bacterial infections. One common bacterial infection is the Helicobacter pylori infection. Gastritis can also result from excessive alcohol consumption, taking certain medicines, or having too much acid in the stomach.  SYMPTOMS  In some cases, there are no symptoms. When symptoms are present, they may include:  Pain or a burning sensation in the upper abdomen.  Nausea.  Vomiting.  An uncomfortable feeling of fullness after eating. DIAGNOSIS  Your caregiver may suspect you have gastritis based on your symptoms and a physical exam. To determine the cause of your gastritis, your caregiver may perform the following:  Blood or stool tests to check for the H pylori bacterium.  Gastroscopy. A thin, flexible tube (endoscope) is passed down the esophagus and into the stomach. The endoscope has a light and camera on the end. Your caregiver uses the endoscope to view the inside of the stomach.  Taking a tissue sample (biopsy) from the stomach to examine under a microscope. TREATMENT  Depending on the cause of your gastritis, medicines may be prescribed. If you have a bacterial infection, such as an H pylori infection, antibiotics may be given. If your gastritis is caused by too much acid in the stomach, H2 blockers or antacids may be given. Your caregiver may recommend that you stop taking aspirin, ibuprofen, or other nonsteroidal  anti-inflammatory drugs (NSAIDs). HOME CARE INSTRUCTIONS  Only take over-the-counter or prescription medicines as directed by your caregiver.  If you were given antibiotic medicines, take them as directed. Finish them even if you start to feel better.  Drink enough fluids to keep your urine clear or pale yellow.  Avoid foods and drinks that make your symptoms worse, such as:  Caffeine or alcoholic drinks.  Chocolate.  Peppermint or mint flavorings.  Garlic and onions.  Spicy foods.  Citrus fruits, such as oranges, lemons, or limes.  Tomato-based foods such as sauce, chili, salsa, and pizza.  Fried and fatty foods.  Eat small, frequent meals instead of large meals. SEEK IMMEDIATE MEDICAL CARE IF:   You have black or dark red stools.  You vomit blood or material that looks like coffee grounds.  You are unable to keep fluids down.  Your abdominal pain gets worse.  You have a fever.  You do not feel better after 1 week.  You have any other questions or concerns. MAKE SURE YOU:  Understand these instructions.  Will watch your condition.  Will get help right away if you are not doing well or get worse.   This information is not intended to replace advice given to you by your health care provider. Make sure you discuss any questions you have with your health care provider.   Document Released: 11/16/2001 Document Revised: 05/23/2012 Document Reviewed: 01/05/2012 Elsevier Interactive Patient Education 2016 [employer]   Food Choices for Gastroesophageal Reflux Disease, Adult When you have gastroesophageal reflux disease (GERD), the foods you eat and your eating habits are very important. Choosing  the right foods can help ease the discomfort of GERD. WHAT GENERAL GUIDELINES DO I NEED TO FOLLOW?  Choose fruits, vegetables, whole grains, low-fat dairy products, and low-fat meat, fish, and poultry.  Limit fats such as oils, salad dressings, butter, nuts, and  avocado.  Keep a food diary to identify foods that cause symptoms.  Avoid foods that cause reflux. These may be different for different people.  Eat frequent small meals instead of three large meals each day.  Eat your meals slowly, in a relaxed setting.  Limit fried foods.  Cook foods using methods other than frying.  Avoid drinking alcohol.  Avoid drinking large amounts of liquids with your meals.  Avoid bending over or lying down until 2-3 hours after eating. WHAT FOODS ARE NOT RECOMMENDED? The following are some foods and drinks that may worsen your symptoms: Vegetables Tomatoes. Tomato juice. Tomato and spaghetti sauce. Chili peppers. Onion and garlic. Horseradish. Fruits Oranges, grapefruit, and lemon (fruit and juice). Meats High-fat meats, fish, and poultry. This includes hot dogs, ribs, ham, sausage, salami, and bacon. Dairy Whole milk and chocolate milk. Sour cream. Cream. Butter. Ice cream. Cream cheese.  Beverages Coffee and tea, with or without caffeine. Carbonated beverages or energy drinks. Condiments Hot sauce. Barbecue sauce.  Sweets/Desserts Chocolate and cocoa. Donuts. Peppermint and spearmint. Fats and Oils High-fat foods, including [place] fries and potato chips. Other Vinegar. Strong spices, such as black pepper, white pepper, red pepper, cayenne, curry powder, cloves, ginger, and chili powder. The items listed above may not be a complete list of foods and beverages to avoid. Contact your dietitian for more information.   This information is not intended to replace advice given to you by your health care provider. Make sure you discuss any questions you have with your health care provider.   Document Released: 11/22/2005 Document Revised: 12/13/2014 Document Reviewed: 09/26/2013 Elsevier Interactive Patient Education 2016 Elsevier Inc.  Gastroesophageal Reflux Disease, Adult Normally, food travels down the esophagus and stays in the stomach to be  digested. However, when a person has gastroesophageal reflux disease (GERD), food and stomach acid move back up into the esophagus. When this happens, the esophagus becomes sore and inflamed. Over time, GERD can create small holes (ulcers) in the lining of the esophagus.  CAUSES This condition is caused by a problem with the muscle between the esophagus and the stomach (lower esophageal sphincter, or LES). Normally, the LES muscle closes after food passes through the esophagus to the stomach. When the LES is weakened or abnormal, it does not close properly, and that allows food and stomach acid to go back up into the esophagus. The LES can be weakened by certain dietary substances, medicines, and medical conditions, including:  Tobacco use.  Pregnancy.  Having a hiatal hernia.  Heavy alcohol use.  Certain foods and beverages, such as coffee, chocolate, onions, and peppermint. RISK FACTORS This condition is more likely to develop in:  People who have an increased body weight.  People who have connective tissue disorders.  People who use NSAID medicines. SYMPTOMS Symptoms of this condition include:  Heartburn.  Difficult or painful swallowing.  The feeling of having a lump in the throat.  Abitter taste in the mouth.  Bad breath.  Having a large amount of saliva.  Having an upset or bloated stomach.  Belching.  Chest pain.  Shortness of breath or wheezing.  Ongoing (chronic) cough or a night-time cough.  Wearing away of tooth enamel.  Weight loss. Different conditions  can cause chest pain. Make sure to see your health care provider if you experience chest pain. DIAGNOSIS Your health care provider will take a medical history and perform a physical exam. To determine if you have mild or severe GERD, your health care provider may also monitor how you respond to treatment. You may also have other tests, including:  An endoscopy toexamine your stomach and esophagus with  a small camera.  A test thatmeasures the acidity level in your esophagus.  A test thatmeasures how much pressure is on your esophagus.  A barium swallow or modified barium swallow to show the shape, size, and functioning of your esophagus. TREATMENT The goal of treatment is to help relieve your symptoms and to prevent complications. Treatment for this condition may vary depending on how severe your symptoms are. Your health care provider may recommend:  Changes to your diet.  Medicine.  Surgery. HOME CARE INSTRUCTIONS Diet  Follow a diet as recommended by your health care provider. This may involve avoiding foods and drinks such as:  Coffee and tea (with or without caffeine).  Drinks that containalcohol.  Energy drinks and sports drinks.  Carbonated drinks or sodas.  Chocolate and cocoa.  Peppermint and mint flavorings.  Garlic and onions.  Horseradish.  Spicy and acidic foods, including peppers, chili powder, curry powder, vinegar, hot sauces, and barbecue sauce.  Citrus fruit juices and citrus fruits, such as oranges, lemons, and limes.  Tomato-based foods, such as red sauce, chili, salsa, and pizza with red sauce.  Fried and fatty foods, such as donuts, french fries, potato chips, and high-fat dressings.  High-fat meats, such as hot dogs and fatty cuts of red and white meats, such as rib eye steak, sausage, ham, and bacon.  High-fat dairy items, such as whole milk, butter, and cream cheese.  Eat small, frequent meals instead of large meals.  Avoid drinking large amounts of liquid with your meals.  Avoid eating meals during the 2-3 hours before bedtime.  Avoid lying down right after you eat.  Do not exercise right after you eat. General Instructions  Pay attention to any changes in your symptoms.  Take over-the-counter and prescription medicines only as told by your health care provider. Do not take aspirin, ibuprofen, or other NSAIDs unless your  health care provider told you to do so.  Do not use any tobacco products, including cigarettes, chewing tobacco, and e-cigarettes. If you need help quitting, ask your health care provider.  Wear loose-fitting clothing. Do not wear anything tight around your waist that causes pressure on your abdomen.  Raise (elevate) the head of your bed 6 inches (15cm).  Try to reduce your stress, such as with yoga or meditation. If you need help reducing stress, ask your health care provider.  If you are overweight, reduce your weight to an amount that is healthy for you. Ask your health care provider for guidance about a safe weight loss goal.  Keep all follow-up visits as told by your health care provider. This is important. SEEK MEDICAL CARE IF:  You have new symptoms.  You have unexplained weight loss.  You have difficulty swallowing, or it hurts to swallow.  You have wheezing or a persistent cough.  Your symptoms do not improve with treatment.  You have a hoarse voice. SEEK IMMEDIATE MEDICAL CARE IF:  You have pain in your arms, neck, jaw, teeth, or back.  You feel sweaty, dizzy, or light-headed.  You have chest pain or shortness of breath.  You vomit and your vomit looks like blood or coffee grounds.  You faint.  Your stool is bloody or black.  You cannot swallow, drink, or eat.   This information is not intended to replace advice given to you by your health care provider. Make sure you discuss any questions you have with your health care provider.   Document Released: 09/01/2005 Document Revised: 08/13/2015 Document Reviewed: 03/19/2015 Elsevier Interactive Patient Education [employer]

## 2016-02-07 LAB — CULTURE, GROUP A STREP (THRC)

## 2016-02-09 NOTE — Telephone Encounter (Signed)
Left message with lab results on patient voice mail per DPR. 

## 2016-03-05 ENCOUNTER — Ambulatory Visit: Payer: BLUE CROSS/BLUE SHIELD | Admitting: Family Medicine

## 2016-03-09 ENCOUNTER — Ambulatory Visit: Payer: BLUE CROSS/BLUE SHIELD | Admitting: Family Medicine

## 2016-07-22 ENCOUNTER — Telehealth: Payer: BLUE CROSS/BLUE SHIELD | Admitting: Physician Assistant

## 2016-07-22 DIAGNOSIS — J208 Acute bronchitis due to other specified organisms: Principal | ICD-10-CM

## 2016-07-22 DIAGNOSIS — B9689 Other specified bacterial agents as the cause of diseases classified elsewhere: Secondary | ICD-10-CM

## 2016-07-22 DIAGNOSIS — J00 Acute nasopharyngitis [common cold]: Secondary | ICD-10-CM

## 2016-07-22 MED ORDER — AZITHROMYCIN 250 MG PO TABS: ORAL_TABLET | ORAL | 0 refills | Status: DC

## 2016-07-22 NOTE — Progress Notes (Signed)

## 2016-08-17 ENCOUNTER — Inpatient Hospital Stay: Admission: RE | Admit: 2016-08-17 | Payer: BLUE CROSS/BLUE SHIELD | Admitting: Obstetrics and Gynecology

## 2017-01-05 ENCOUNTER — Telehealth: Payer: BLUE CROSS/BLUE SHIELD | Admitting: Family

## 2017-01-05 DIAGNOSIS — R6889 Other general symptoms and signs: Secondary | ICD-10-CM

## 2017-01-05 NOTE — Progress Notes (Signed)
E visit for Flu like symptoms   We are sorry that you are not feeling well.  Here is how we plan to help! Based on what you have shared with me it looks like you may have a respiratory virus that may be influenza.  Influenza or "the flu" is   an infection caused by a respiratory virus. The flu virus is highly contagious and persons who did not receive their yearly flu vaccination may "catch" the flu from close contact.  We have anti-viral medications to treat the viruses that cause this infection. They are not a "cure" and only shorten the course of the infection. These prescriptions are most effective when they are given within the first 2 days of "flu" symptoms. Antiviral medication are indicated if you have a high risk of complications from the flu. You should  also consider an antiviral medication if you are in close contact with someone who is at risk. These medications can help patients avoid complications from the flu  but have side effects that you should know. Possible side effects from Tamiflu or oseltamivir include nausea, vomiting, diarrhea, dizziness, headaches, eye redness, sleep problems or other respiratory symptoms. You should not take Tamiflu if you have an allergy to oseltamivir or any to the ingredients in Tamiflu.  Based upon your symptoms and potential risk factors I recommend that you follow the flu symptoms recommendation that I have listed below. If your symptoms worsen you need to follow up with your primary care provider!  ANYONE WHO HAS FLU SYMPTOMS SHOULD: . Stay home. The flu is highly contagious and going out or to work exposes others! . Be sure to drink plenty of fluids. Water is fine as well as fruit juices, sodas and electrolyte beverages. You may want to stay away from caffeine or alcohol. If you are nauseated, try taking small sips of liquids. How do you know if you are getting enough fluid? Your urine should be a pale yellow or almost colorless. . Get  rest. . Taking a steamy shower or using a humidifier may help nasal congestion and ease sore throat pain. Using a saline nasal spray works much the same way. . Cough drops, hard candies and sore throat lozenges may ease your cough. . Line up a caregiver. Have someone check on you regularly.   GET HELP RIGHT AWAY IF: . You cannot keep down liquids or your medications. . You become short of breath . Your fell like you are going to pass out or loose consciousness. . Your symptoms persist after you have completed your treatment plan MAKE SURE YOU   Understand these instructions.  Will watch your condition.  Will get help right away if you are not doing well or get worse.  Your e-visit answers were reviewed by a board certified advanced clinical practitioner to complete your personal care plan.  Depending on the condition, your plan could have included both over the counter or prescription medications.  If there is a problem please reply  once you have received a response from your provider.  Your safety is important to us.  If you have drug allergies check your prescription carefully.    You can use MyChart to ask questions about today's visit, request a non-urgent call back, or ask for a work or school excuse for 24 hours related to this e-Visit. If it has been greater than 24 hours you will need to follow up with your provider, or enter a new e-Visit to address those  concerns.  You will get an e-mail in the next two days asking about your experience.  I hope that your e-visit has been valuable and will speed your recovery. Thank you for using e-visits.

## 2018-10-30 ENCOUNTER — Telehealth: Payer: BLUE CROSS/BLUE SHIELD | Admitting: Physician Assistant

## 2018-10-30 ENCOUNTER — Ambulatory Visit: Payer: Self-pay | Admitting: Obstetrics and Gynecology

## 2018-10-30 ENCOUNTER — Encounter: Payer: Self-pay | Admitting: Obstetrics and Gynecology

## 2018-10-30 VITALS — BP 130/80 | HR 85 | Temp 98.7°F | Wt 158.6 lb

## 2018-10-30 DIAGNOSIS — J028 Acute pharyngitis due to other specified organisms: Secondary | ICD-10-CM

## 2018-10-30 DIAGNOSIS — R0981 Nasal congestion: Secondary | ICD-10-CM

## 2018-10-30 DIAGNOSIS — J329 Chronic sinusitis, unspecified: Secondary | ICD-10-CM

## 2018-10-30 DIAGNOSIS — R0600 Dyspnea, unspecified: Secondary | ICD-10-CM

## 2018-10-30 DIAGNOSIS — B9789 Other viral agents as the cause of diseases classified elsewhere: Secondary | ICD-10-CM | POA: Insufficient documentation

## 2018-10-30 DIAGNOSIS — J069 Acute upper respiratory infection, unspecified: Secondary | ICD-10-CM

## 2018-10-30 DIAGNOSIS — J029 Acute pharyngitis, unspecified: Secondary | ICD-10-CM

## 2018-10-30 DIAGNOSIS — H539 Unspecified visual disturbance: Secondary | ICD-10-CM

## 2018-10-30 MED ORDER — PROMETHAZINE-DM 6.25-15 MG/5ML PO SYRP: 5.0000 mL | ORAL_SOLUTION | Freq: Four times a day (QID) | ORAL | 0 refills | Status: DC | PRN

## 2018-10-30 MED ORDER — LIDOCAINE VISCOUS HCL 2 % MT SOLN: 5.0000 mL | OROMUCOSAL | 0 refills | Status: DC | PRN

## 2018-10-30 NOTE — Progress Notes (Signed)
Based on what you shared with me it looks like you have a serious condition that should be evaluated in a face to face office visit.  -IN THE SETTING OF POSSIBLE SINUS INFECTION, VISUAL CHANGES SUCH AS DOUBLE/BLURRED VISION WARRANT FURTHER INVESTIGATION. -  POSSIBLE "LUMP IN THROAT" MIGHT SUGGEST ABSCESS ON THROAT AND PHYSICAL EXAMINATION IS WARRANTED - DUE TO VOMITING YOU MAY BE UNABLE TO TAKE MEDICATIONS. -DYSPNEA COULD BE AN EXACERBATION OF YOUR ASTHMA. -DUE TO ABOVE REASONS, IT IS BETTER TO HAVE A FACE TO FACE EVALUATION  NOTE: If you entered your credit card information for this eVisit, you will not be charged. You may see a "hold" on your card for the $30 but that hold will drop off and you will not have a charge processed.  If you are having a true medical emergency please call 911.  If you need an urgent face to face visit, [facility] has four urgent care centers for your convenience.  If you need care fast and have a high deductible or no insurance consider:   [email or web address] to reserve your spot online an avoid wait times  [facility] [address], [address] [place], [place] [address] 8 am to 8 pm Monday-Friday 10 am to 4 pm Saturday-Sunday *Across the street from [employer]  [facility]  [address] [place], [address] 8 am to 5 pm Monday-Friday * In the [facility] on the [facility]   The following sites will take your  insurance:  . [facility]  [phone] Get Driving Directions Find a Provider at this Location  [address] [place], [place] [address] . 10 am to 8 pm Monday-Friday . 12 pm to 8 pm Saturday-Sunday   . [facility] at [facility]  [phone] Get Driving Directions Find a Provider at this Location  [address] [place], [place] [address] . 8 am to 8 pm Monday-Friday . 9 am to 6 pm Saturday . 11 am to 6 pm Sunday   . [facility] at [facility] [place]  [phone] Get Driving Directions  [id number] [place], [place] [address] . 8 am to 8 pm Monday-Friday . 8 am to 4 pm Saturday-Sunday   Your e-visit answers were reviewed by a board certified advanced clinical practitioner to complete your personal care plan.  Thank you for using e-Visits.

## 2018-10-30 NOTE — Progress Notes (Signed)
  Subjective:     Patient ID: Meagan Mason, female   DOB: 23-Jul-1981, 37 y.o.   MRN: [id number]  HPI  Meagan Mason is a 37 y.o. female here with sinus pressure, congestion and sore throat. Sore throat started on Thursday, fatigue on Friday. She woke up Saturday with head congestion and  HA. Says she was able to go to work today and felt slightly better, however wanted to be sure the symptoms had no gone to her chest.  Current cigarette smoker. No fever or wheezing.   Review of Systems  Constitutional: Positive for fatigue. Negative for fever.  HENT: Positive for congestion, sinus pressure, sinus pain and sore throat.   Respiratory: Positive for cough (Mild cough). Negative for shortness of breath and wheezing.    Objective:   Physical Exam  Constitutional: She appears well-developed and well-nourished.  Non-toxic appearance. She has a sickly appearance. She appears ill. No distress.  HENT:  Right Ear: Hearing, tympanic membrane, external ear and ear canal normal.  Left Ear: Hearing, tympanic membrane, external ear and ear canal normal.  Nose: Right sinus exhibits maxillary sinus tenderness and frontal sinus tenderness. Left sinus exhibits maxillary sinus tenderness and frontal sinus tenderness.  Mouth/Throat: Mucous membranes are dry. No uvula swelling. Posterior oropharyngeal erythema present. No oropharyngeal exudate, posterior oropharyngeal edema or tonsillar abscesses. Tonsils are 1+ on the right. Tonsils are 1+ on the left. No tonsillar exudate.   Assessment:   1. Acute upper respiratory infection   2. Head congestion   3. Sore throat (viral)    Plan:   - Expectant management. 4 day history of congestion and mild sore throat. Has not tried many OTC medications for symptoms. - Rx: Phenergan cough, viscous lidocaine - nasal flushes at home - Ok to use ibuprofen as directed on the bottle - patient has albuterol inhaler at home as needed -cool mist humidifier in bedroom -  return if symptoms are not improving after 7-10 days  Meagan Mason, Meagan Mason I, NP 10/30/2018 6:24 PM

## 2018-10-31 ENCOUNTER — Other Ambulatory Visit: Payer: Self-pay | Admitting: Obstetrics and Gynecology

## 2018-10-31 MED ORDER — BENZONATATE 200 MG PO CAPS: 200.0000 mg | ORAL_CAPSULE | Freq: Two times a day (BID) | ORAL | 0 refills | Status: DC | PRN

## 2018-10-31 MED ORDER — PREDNISONE 20 MG PO TABS: 20.0000 mg | ORAL_TABLET | Freq: Every day | ORAL | 0 refills | Status: DC

## 2018-10-31 NOTE — Progress Notes (Signed)
Meagan Mason called back today and stated that her cough is worse today. She is requesting something for the cough.  Patient is a smoker.   Rx: Tessalon Perles and prednisone called into pharmacy.

## 2019-04-19 ENCOUNTER — Encounter (HOSPITAL_COMMUNITY): Payer: Self-pay

## 2019-04-19 ENCOUNTER — Encounter (HOSPITAL_COMMUNITY): Payer: Self-pay | Admitting: Emergency Medicine

## 2019-04-19 ENCOUNTER — Other Ambulatory Visit: Payer: Self-pay

## 2019-04-19 ENCOUNTER — Ambulatory Visit (HOSPITAL_COMMUNITY)
Admission: EM | Admit: 2019-04-19 | Discharge: 2019-04-19 | Disposition: A | Discharge status: Home / Self Care | Payer: Commercial Managed Care - PPO | Source: Home / Self Care | Attending: Internal Medicine | Admitting: Internal Medicine

## 2019-04-19 ENCOUNTER — Emergency Department (HOSPITAL_COMMUNITY)
Admission: EM | Admit: 2019-04-19 | Discharge: 2019-04-20 | Disposition: A | Discharge status: Home / Self Care | Payer: Commercial Managed Care - PPO | Attending: Emergency Medicine | Admitting: Emergency Medicine

## 2019-04-19 DIAGNOSIS — R9431 Abnormal electrocardiogram [ECG] [EKG]: Secondary | ICD-10-CM | POA: Diagnosis not present

## 2019-04-19 DIAGNOSIS — F17210 Nicotine dependence, cigarettes, uncomplicated: Secondary | ICD-10-CM | POA: Diagnosis not present

## 2019-04-19 DIAGNOSIS — I4581 Long QT syndrome: Secondary | ICD-10-CM | POA: Diagnosis not present

## 2019-04-19 DIAGNOSIS — R109 Unspecified abdominal pain: Secondary | ICD-10-CM | POA: Diagnosis present

## 2019-04-19 DIAGNOSIS — A059 Bacterial foodborne intoxication, unspecified: Secondary | ICD-10-CM

## 2019-04-19 DIAGNOSIS — J45909 Unspecified asthma, uncomplicated: Secondary | ICD-10-CM | POA: Insufficient documentation

## 2019-04-19 DIAGNOSIS — E876 Hypokalemia: Secondary | ICD-10-CM | POA: Diagnosis not present

## 2019-04-19 DIAGNOSIS — R197 Diarrhea, unspecified: Secondary | ICD-10-CM | POA: Diagnosis not present

## 2019-04-19 DIAGNOSIS — R112 Nausea with vomiting, unspecified: Secondary | ICD-10-CM

## 2019-04-19 DIAGNOSIS — F411 Generalized anxiety disorder: Secondary | ICD-10-CM | POA: Insufficient documentation

## 2019-04-19 LAB — CBC
HCT: 45.3 % (ref 36.0–46.0)
Hemoglobin: 15.7 g/dL — ABNORMAL HIGH (ref 12.0–15.0)
MCH: 34 pg (ref 26.0–34.0)
MCHC: 34.7 g/dL (ref 30.0–36.0)
MCV: 98.1 fL (ref 80.0–100.0)
Platelets: 359 10*3/uL (ref 150–400)
RBC: 4.62 MIL/uL (ref 3.87–5.11)
RDW: 13.4 % (ref 11.5–15.5)
WBC: 16.9 10*3/uL — ABNORMAL HIGH (ref 4.0–10.5)
nRBC: 0 % (ref 0.0–0.2)

## 2019-04-19 LAB — CBC WITH DIFFERENTIAL/PLATELET
Abs Immature Granulocytes: 0.18 10*3/uL — ABNORMAL HIGH (ref 0.00–0.07)
Basophils Absolute: 0.1 10*3/uL (ref 0.0–0.1)
Basophils Relative: 0 %
Eosinophils Absolute: 0 10*3/uL (ref 0.0–0.5)
Eosinophils Relative: 0 %
HCT: 45 % (ref 36.0–46.0)
Hemoglobin: 15.7 g/dL — ABNORMAL HIGH (ref 12.0–15.0)
Immature Granulocytes: 1 %
Lymphocytes Relative: 4 %
Lymphs Abs: 1 10*3/uL (ref 0.7–4.0)
MCH: 34.7 pg — ABNORMAL HIGH (ref 26.0–34.0)
MCHC: 34.9 g/dL (ref 30.0–36.0)
MCV: 99.6 fL (ref 80.0–100.0)
Monocytes Absolute: 1 10*3/uL (ref 0.1–1.0)
Monocytes Relative: 4 %
Neutro Abs: 21.4 10*3/uL — ABNORMAL HIGH (ref 1.7–7.7)
Neutrophils Relative %: 91 %
Platelets: 348 10*3/uL (ref 150–400)
RBC: 4.52 MIL/uL (ref 3.87–5.11)
RDW: 13.4 % (ref 11.5–15.5)
WBC: 23.6 10*3/uL — ABNORMAL HIGH (ref 4.0–10.5)
nRBC: 0 % (ref 0.0–0.2)

## 2019-04-19 LAB — COMPREHENSIVE METABOLIC PANEL
ALT: 35 U/L (ref 0–44)
AST: 37 U/L (ref 15–41)
Albumin: 4.8 g/dL (ref 3.5–5.0)
Alkaline Phosphatase: 62 U/L (ref 38–126)
Anion gap: 19 — ABNORMAL HIGH (ref 5–15)
BUN: 13 mg/dL (ref 6–20)
CO2: 19 mmol/L — ABNORMAL LOW (ref 22–32)
Calcium: 10 mg/dL (ref 8.9–10.3)
Chloride: 101 mmol/L (ref 98–111)
Creatinine, Ser: 0.95 mg/dL (ref 0.44–1.00)
GFR calc Af Amer: >60 mL/min (ref >60)
GFR calc non Af Amer: >60 mL/min (ref >60)
Glucose, Bld: 157 mg/dL — ABNORMAL HIGH (ref 70–99)
Potassium: 3 mmol/L — ABNORMAL LOW (ref 3.5–5.1)
Sodium: 139 mmol/L (ref 135–145)
Total Bilirubin: 1 mg/dL (ref 0.3–1.2)
Total Protein: 8.6 g/dL — ABNORMAL HIGH (ref 6.5–8.1)

## 2019-04-19 LAB — TSH: TSH: 1.169 u[IU]/mL (ref 0.350–4.500)

## 2019-04-19 LAB — LIPASE, BLOOD: Lipase: 23 U/L (ref 11–51)

## 2019-04-19 LAB — T4, FREE: Free T4: 0.82 ng/dL (ref 0.82–1.77)

## 2019-04-19 MED ORDER — ONDANSETRON HCL 4 MG PO TABS: 4.0000 mg | ORAL_TABLET | Freq: Three times a day (TID) | ORAL | 0 refills | Status: DC | PRN

## 2019-04-19 MED ORDER — FAMOTIDINE IN NACL 20-0.9 MG/50ML-% IV SOLN
20.0000 mg | Freq: Once | INTRAVENOUS | Status: AC
  Administered 2019-04-19: 20 mg via INTRAVENOUS
  Filled 2019-04-19: qty 50

## 2019-04-19 MED ORDER — SODIUM CHLORIDE 0.9 % IV BOLUS
1000.0000 mL | Freq: Once | INTRAVENOUS | Status: AC
  Administered 2019-04-19: 1000 mL via INTRAVENOUS

## 2019-04-19 MED ORDER — ONDANSETRON HCL 4 MG/2ML IJ SOLN
4.0000 mg | Freq: Once | INTRAMUSCULAR | Status: AC
  Administered 2019-04-19: 4 mg via INTRAVENOUS
  Filled 2019-04-19: qty 2

## 2019-04-19 MED ORDER — POTASSIUM CHLORIDE CRYS ER 20 MEQ PO TBCR
40.0000 meq | EXTENDED_RELEASE_TABLET | Freq: Once | ORAL | Status: AC
  Administered 2019-04-20: 40 meq via ORAL
  Filled 2019-04-19: qty 2

## 2019-04-19 MED ORDER — SERTRALINE HCL 50 MG PO TABS: 50.0000 mg | ORAL_TABLET | Freq: Every day | ORAL | 1 refills | Status: DC

## 2019-04-19 MED ORDER — HYDROXYZINE HCL 25 MG PO TABS: 25.0000 mg | ORAL_TABLET | Freq: Three times a day (TID) | ORAL | 0 refills | Status: DC | PRN

## 2019-04-19 NOTE — ED Provider Notes (Signed)
[facility]    CSN: [id number] Arrival date & time: 04/19/19  1543     History   Chief Complaint Chief Complaint  Patient presents with  . Emesis  . Panic Attack    HPI Meagan Mason is a 38 y.o. female with a history of asthma-controlled with albuterol and generalized anxiety disorder currently seeing a therapist for that comes to urgent care with complaints of worsening panic attacks over the past few weeks.  Patient says that the panic attacks are becoming more frequent and more sustained.  She attributes it to the COVID-19 pandemic.  He denies any relieving factors.  She denies any suicidal and homicidal ideation.  Patient also complains of nausea, vomiting and diarrhea of 1 day duration.  This started after she ate some fast food yesterday.  She says she has had several episodes of nonbilious nonbloody vomitus.  No relieving factors.  She has had one loose bowel movement today.  Symptoms are associated with epigastric abdominal pain.  No known aggravating factors.  No dizziness or lightheadedness.  HPI  Past Medical History:  Diagnosis Date  . Allergy   . Asthma    states only when stressed out  . History of bronchitis    in college  . Neck pain    ruptured disc in neck  . URI (upper respiratory infection)    early Nov 2014  . Weakness    numbness in left arm    Patient Active Problem List   Diagnosis Date Noted  . Acute upper respiratory infection 10/30/2018  . Head congestion 10/30/2018  . Sore throat (viral) 10/30/2018  . Gastritis 02/06/2016  . Tobacco abuse 02/06/2016  . Health care maintenance 02/06/2016    Past Surgical History:  Procedure Laterality Date  . WISDOM TOOTH EXTRACTION      OB History   No obstetric history on file.      Home Medications    Prior to Admission medications   Medication Sig Start Date End Date Taking? Authorizing Provider  albuterol (PROVENTIL HFA;VENTOLIN HFA) 108 (90 BASE) MCG/ACT inhaler Inhale 2 puffs  into the lungs every 6 (six) hours as needed for wheezing or shortness of breath.     [provider]  hydrOXYzine (ATARAX/VISTARIL) 25 MG tablet Take 1 tablet (25 mg total) by mouth every 8 (eight) hours as needed for anxiety. 04/19/19   LampteyBritta Mccreedy, MD  norethindrone-ethinyl estradiol (JUNEL FE,GILDESS FE,LOESTRIN FE) 1-20 MG-MCG tablet Take 1 tablet by mouth daily. Reported on 02/06/2016    [provider]  ondansetron (ZOFRAN) 4 MG tablet Take 1 tablet (4 mg total) by mouth every 8 (eight) hours as needed for nausea or vomiting. 04/19/19   Nihal Marzella, Britta Mccreedy, MD  pantoprazole (PROTONIX) 40 MG tablet Take 1 tablet (40 mg total) by mouth daily. 02/04/16   Melton Krebs, PA-C  sertraline (ZOLOFT) 50 MG tablet Take 1 tablet (50 mg total) by mouth daily. 04/19/19   LampteyBritta Mccreedy, MD    Family History Family History  Problem Relation Age of Onset  . Heart disease Mother   . Hypertension Mother   . Heart attack Mother 66  . Early death Father   . Cancer Father 18       stomach cancer  . Stroke Father   . Heart attack Father        After stroke  . Breast cancer Paternal Grandmother 36    Social History Social History   Tobacco Use  .  Smoking status: Current Every Day Smoker    Packs/day: 0.25    Years: 10.00    Pack years: 2.50    Types: Cigarettes  . Smokeless tobacco: Never Used  Substance Use Topics  . Alcohol use: Yes    Comment: wine 2-3 times a week  . Drug use: No     Allergies   Other   Review of Systems Review of Systems  Constitutional: Positive for activity change and appetite change. Negative for fatigue and fever.  HENT: Negative.   Respiratory: Negative.   Gastrointestinal: Positive for abdominal pain, diarrhea, nausea and vomiting. Negative for abdominal distention.  Endocrine: Negative.   Genitourinary: Negative.   Musculoskeletal: Negative.   Skin: Negative.   Allergic/Immunologic: Negative.   Neurological: Positive for  weakness. Negative for dizziness, syncope, light-headedness and headaches.  Psychiatric/Behavioral: Negative for agitation, decreased concentration and suicidal ideas.  All other systems reviewed and are negative.    Physical Exam Triage Vital Signs ED Triage Vitals  Enc Vitals Group     BP 04/19/19 1626 (!) 146/103     Pulse Rate 04/19/19 1626 (!) 119     Resp 04/19/19 1626 16     Temp 04/19/19 1626 98.6 F (37 C)     Temp Source 04/19/19 1626 Oral     SpO2 04/19/19 1626 97 %     Weight --      Height --      Head Circumference --      Peak Flow --      Pain Score 04/19/19 1627 0     Pain Loc --      Pain Edu? --      Excl. in GC? --    No data found.  Updated Vital Signs BP (!) 146/103 (BP Location: Right Arm)   Pulse (!) 119   Temp 98.6 F (37 C) (Oral)   Resp 16   LMP 04/06/2019   SpO2 97%   Visual Acuity Right Eye Distance:   Left Eye Distance:   Bilateral Distance:    Right Eye Near:   Left Eye Near:    Bilateral Near:     Physical Exam Constitutional:      General: She is not in acute distress.    Appearance: Normal appearance. She is not ill-appearing.  HENT:     Nose: Nose normal. No congestion.     Mouth/Throat:     Mouth: Mucous membranes are moist.     Pharynx: No oropharyngeal exudate or posterior oropharyngeal erythema.  Eyes:     Conjunctiva/sclera: Conjunctivae normal.  Neck:     Musculoskeletal: Normal range of motion and neck supple.  Cardiovascular:     Rate and Rhythm: Normal rate and regular rhythm.     Pulses: Normal pulses.     Heart sounds: Normal heart sounds. No murmur. No gallop.   Pulmonary:     Effort: Pulmonary effort is normal.     Breath sounds: Normal breath sounds.  Abdominal:     General: Bowel sounds are normal. There is no distension.     Palpations: Abdomen is soft.     Tenderness: There is no abdominal tenderness. There is no guarding.  Musculoskeletal: Normal range of motion.  Skin:    General: Skin is  warm and dry.     Capillary Refill: Capillary refill takes less than 2 seconds.     Coloration: Skin is not jaundiced.     Findings: No bruising.  Neurological:  General: No focal deficit present.     Mental Status: She is alert and oriented to person, place, and time.  Psychiatric:        Behavior: Behavior normal.        Thought Content: Thought content normal.        Judgment: Judgment normal.      UC Treatments / Results  Labs (all labs ordered are listed, but only abnormal results are displayed) Labs Reviewed  CBC - Abnormal; Notable for the following components:      Result Value   WBC 16.9 (*)    Hemoglobin 15.7 (*)    All other components within normal limits  TSH  T4, FREE    EKG None  Radiology No results found.  Procedures Procedures (including critical care time)  Medications Ordered in UC Medications - No data to display  Initial Impression / Assessment and Plan / UC Course  I have reviewed the triage vital signs and the nursing notes.  Pertinent labs & imaging results that were available during my care of the patient were reviewed by me and considered in my medical decision making (see chart for details).     1.  Food poisoning with nausea/vomiting: Zofran ODT as needed for nausea vomiting Encourage electrolyte balanced oral fluid intake Continue Pepcid for epigastric pain Resume clear liquids and advance to regular diet as tolerated.  2.  Generalized anxiety disorder, uncontrolled: Zoloft 50 mg orally daily PCP referral for drug monitoring and further management CBC, free T4/TSH. Final Clinical Impressions(s) / UC Diagnoses   Final diagnoses:  Food poisoning due to bacteria  Generalized anxiety disorder   Discharge Instructions   None    ED Prescriptions    Medication Sig Dispense Auth. Provider   sertraline (ZOLOFT) 50 MG tablet Take 1 tablet (50 mg total) by mouth daily. 30 tablet Anouk Critzer, Britta MccreedyPhilip O, MD   hydrOXYzine  (ATARAX/VISTARIL) 25 MG tablet Take 1 tablet (25 mg total) by mouth every 8 (eight) hours as needed for anxiety. 12 tablet Carrissa Taitano, Britta MccreedyPhilip O, MD   ondansetron (ZOFRAN) 4 MG tablet Take 1 tablet (4 mg total) by mouth every 8 (eight) hours as needed for nausea or vomiting. 20 tablet Ethen Bannan, Britta MccreedyPhilip O, MD     Controlled Substance Prescriptions [place] Controlled Substance Registry consulted? No   Merrilee JanskyLamptey, Dshawn Mcnay O, MD 04/19/19 1815

## 2019-04-19 NOTE — ED Triage Notes (Signed)
Pt present some vomiting and a panic attack. The vomiting started yesterday.

## 2019-04-19 NOTE — ED Triage Notes (Signed)
Abd pain with n/v/d recent emesis of bile and blood per pt report onset x2 days denies fever or cough.

## 2019-04-20 ENCOUNTER — Encounter (HOSPITAL_COMMUNITY): Payer: Self-pay

## 2019-04-20 ENCOUNTER — Encounter (HOSPITAL_COMMUNITY): Payer: Self-pay | Admitting: Emergency Medicine

## 2019-04-20 ENCOUNTER — Other Ambulatory Visit: Payer: Self-pay

## 2019-04-20 ENCOUNTER — Emergency Department (HOSPITAL_COMMUNITY)
Admission: EM | Admit: 2019-04-20 | Discharge: 2019-04-20 | Disposition: A | Discharge status: Home / Self Care | Payer: Commercial Managed Care - PPO | Source: Home / Self Care

## 2019-04-20 ENCOUNTER — Emergency Department (HOSPITAL_COMMUNITY): Payer: Commercial Managed Care - PPO

## 2019-04-20 LAB — URINALYSIS, ROUTINE W REFLEX MICROSCOPIC
Bilirubin Urine: NEGATIVE
Glucose, UA: NEGATIVE mg/dL
Ketones, ur: 80 mg/dL — AB
Leukocytes,Ua: NEGATIVE
Nitrite: NEGATIVE
Protein, ur: 30 mg/dL — AB
Specific Gravity, Urine: >1.046 — ABNORMAL HIGH (ref 1.005–1.030)
pH: 7 (ref 5.0–8.0)

## 2019-04-20 LAB — I-STAT BETA HCG BLOOD, ED (MC, WL, AP ONLY): I-stat hCG, quantitative: <5 m[IU]/mL (ref <5)

## 2019-04-20 MED ORDER — PROMETHAZINE HCL 25 MG PO TABS: 25.0000 mg | ORAL_TABLET | Freq: Four times a day (QID) | ORAL | 0 refills | Status: DC | PRN

## 2019-04-20 MED ORDER — PROMETHAZINE HCL 25 MG/ML IJ SOLN
25.0000 mg | Freq: Once | INTRAMUSCULAR | Status: AC
  Administered 2019-04-20: 25 mg via INTRAVENOUS
  Filled 2019-04-20: qty 1

## 2019-04-20 MED ORDER — SODIUM CHLORIDE 0.9% FLUSH: 3.0000 mL | Freq: Once | INTRAVENOUS | Status: DC

## 2019-04-20 MED ORDER — IOHEXOL 300 MG/ML  SOLN
100.0000 mL | Freq: Once | INTRAMUSCULAR | Status: AC | PRN
  Administered 2019-04-20: 100 mL via INTRAVENOUS

## 2019-04-20 MED ORDER — SODIUM CHLORIDE (PF) 0.9 % IJ SOLN
INTRAMUSCULAR | Status: AC
  Administered 2019-04-20: 03:00:00
  Filled 2019-04-20: qty 50

## 2019-04-20 MED ORDER — LORAZEPAM 2 MG/ML IJ SOLN
0.5000 mg | Freq: Once | INTRAMUSCULAR | Status: AC
  Administered 2019-04-20: 0.5 mg via INTRAVENOUS
  Filled 2019-04-20: qty 1

## 2019-04-20 NOTE — ED Notes (Signed)
Pt ambulating with no difficulty  

## 2019-04-20 NOTE — ED Triage Notes (Signed)
Patient here from home with complaints of nausea, vomiting. Reports that she was seen here yesterday for same. Phenergan and Zofran with no relief.

## 2019-04-20 NOTE — ED Provider Notes (Addendum)
[facility]EMERGENCY [facility]PT Provider Note   CSN: [id number] Arrival date & time: 04/19/19  2142    History   Chief Complaint Chief Complaint  Patient presents with  . Abdominal Pain  . Nausea  . Emesis    HPI Meagan Mason is a 38 y.o. female.     Patient with history of anxiety presents the emergency department today with 48-hour history of nausea, vomiting, and diarrhea.  Patient also complains of a burning pain in her upper abdomen and chest which started after the nausea and vomiting.  Patient was seen at urgent care today.  She had CBC which showed a elevated white blood cell count and thyroid studies which were normal.  Patient was discharged home with Zofran.  Upon returning home, she had severe nausea and some vomiting after administration of Zofran prompting emergency department visit.  She states that her mouth feels very dry.  No lightheadedness or syncope.  She has chills and subjective fevers.  No chest pain or shortness of breath.  No previous abdominal surgeries.  No urinary symptoms.  She had diarrhea at onset which has now improved.       Past Medical History:  Diagnosis Date  . Allergy   . Asthma    states only when stressed out  . History of bronchitis    in college  . Neck pain    ruptured disc in neck  . URI (upper respiratory infection)    early Nov 2014  . Weakness    numbness in left arm    Patient Active Problem List   Diagnosis Date Noted  . Acute upper respiratory infection 10/30/2018  . Head congestion 10/30/2018  . Sore throat (viral) 10/30/2018  . Gastritis 02/06/2016  . Tobacco abuse 02/06/2016  . Health care maintenance 02/06/2016    Past Surgical History:  Procedure Laterality Date  . WISDOM TOOTH EXTRACTION       OB History   No obstetric history on file.      Home Medications    Prior to Admission medications   Medication Sig Start Date End Date Taking? Authorizing Provider  albuterol (PROVENTIL  HFA;VENTOLIN HFA) 108 (90 BASE) MCG/ACT inhaler Inhale 2 puffs into the lungs every 6 (six) hours as needed for wheezing or shortness of breath.    Yes [provider]  loratadine (CLARITIN) 10 MG tablet Take 10 mg by mouth daily.   Yes [provider]  hydrOXYzine (ATARAX/VISTARIL) 25 MG tablet Take 1 tablet (25 mg total) by mouth every 8 (eight) hours as needed for anxiety. 04/19/19   Lamptey, Britta MccreedyPhilip O, MD  ondansetron (ZOFRAN) 4 MG tablet Take 1 tablet (4 mg total) by mouth every 8 (eight) hours as needed for nausea or vomiting. 04/19/19   Lamptey, Britta MccreedyPhilip O, MD  promethazine (PHENERGAN) 25 MG tablet Take 1 tablet (25 mg total) by mouth every 6 (six) hours as needed for nausea or vomiting. 04/20/19   Renne CriglerGeiple, Hermann Dottavio, PA-C  sertraline (ZOLOFT) 50 MG tablet Take 1 tablet (50 mg total) by mouth daily. 04/19/19   LampteyBritta Mccreedy, Philip O, MD    Family History Family History  Problem Relation Age of Onset  . Heart disease Mother   . Hypertension Mother   . Heart attack Mother 7051  . Early death Father   . Cancer Father 7248       stomach cancer  . Stroke Father   . Heart attack Father        After  stroke  . Breast cancer Paternal Grandmother 53    Social History Social History   Tobacco Use  . Smoking status: Current Every Day Smoker    Packs/day: 0.25    Years: 10.00    Pack years: 2.50    Types: Cigarettes  . Smokeless tobacco: Never Used  Substance Use Topics  . Alcohol use: Yes    Comment: wine 2-3 times a week  . Drug use: No     Allergies   Other and Benadryl [diphenhydramine]   Review of Systems Review of Systems  Constitutional: Negative for appetite change and fever.  HENT: Negative for rhinorrhea and sore throat.   Eyes: Negative for redness.  Respiratory: Negative for cough and shortness of breath.   Cardiovascular: Negative for chest pain.  Gastrointestinal: Positive for abdominal pain, diarrhea, nausea and vomiting. Negative for blood in stool.   Genitourinary: Negative for dysuria.  Musculoskeletal: Negative for myalgias.  Skin: Negative for rash.  Neurological: Negative for light-headedness.     Physical Exam Updated Vital Signs BP 116/69 (BP Location: Right Arm)   Pulse 70   Temp 97.8 F (36.6 C) (Oral)   Resp 14   LMP 04/06/2019 Comment: negative beta HCG 04/20/19  SpO2 98%   Physical Exam Vitals signs and nursing note reviewed.  Constitutional:      Appearance: She is well-developed.  HENT:     Head: Normocephalic and atraumatic.  Eyes:     General:        Right eye: No discharge.        Left eye: No discharge.     Conjunctiva/sclera: Conjunctivae normal.  Neck:     Musculoskeletal: Normal range of motion and neck supple.  Cardiovascular:     Rate and Rhythm: Normal rate and regular rhythm.     Heart sounds: Normal heart sounds.  Pulmonary:     Effort: Pulmonary effort is normal.     Breath sounds: Normal breath sounds.  Abdominal:     Palpations: Abdomen is soft.     Tenderness: There is no abdominal tenderness.     Comments: Patient does not have any significant abdominal pain on my exam.  She is able to move around in bed, sit up, get out of bed and pick up her phone without any guarding or apparent discomfort.  Skin:    General: Skin is warm and dry.  Neurological:     Mental Status: She is alert.      ED Treatments / Results  Labs (all labs ordered are listed, but only abnormal results are displayed) Labs Reviewed  CBC WITH DIFFERENTIAL/PLATELET - Abnormal; Notable for the following components:      Result Value   WBC 23.6 (*)    Hemoglobin 15.7 (*)    MCH 34.7 (*)    Neutro Abs 21.4 (*)    Abs Immature Granulocytes 0.18 (*)    All other components within normal limits  COMPREHENSIVE METABOLIC PANEL - Abnormal; Notable for the following components:   Potassium 3.0 (*)    CO2 19 (*)    Glucose, Bld 157 (*)    Total Protein 8.6 (*)    Anion gap 19 (*)    All other components within  normal limits  URINALYSIS, ROUTINE W REFLEX MICROSCOPIC - Abnormal; Notable for the following components:   APPearance HAZY (*)    Specific Gravity, Urine >1.046 (*)    Hgb urine dipstick SMALL (*)    Ketones, ur 80 (*)  Protein, ur 30 (*)    Bacteria, UA RARE (*)    All other components within normal limits  LIPASE, BLOOD  I-STAT BETA HCG BLOOD, ED ([facility], [facility], [facility] ONLY)    EKG EKG Interpretation  Date/Time:  Friday Apr 20 2019 03:36:14 EDT Ventricular Rate:  71 PR Interval:    QRS Duration: 97 QT Interval:  471 QTC Calculation: 512 R Axis:   66 Text Interpretation:  Sinus rhythm Probable left atrial enlargement Left ventricular hypertrophy Prolonged QT interval Confirmed by Geoffery Lyons ([id number]) on 04/20/2019 3:41:30 AM   Radiology Ct Abdomen Pelvis W Contrast  Result Date: 04/20/2019 CLINICAL DATA:  Acute abdominal pain. Nausea and vomiting. EXAM: CT ABDOMEN AND PELVIS WITH CONTRAST TECHNIQUE: Multidetector CT imaging of the abdomen and pelvis was performed using the standard protocol following bolus administration of intravenous contrast. CONTRAST:  OMNIPAQUE IOHEXOL 300 MG/ML  SOLN COMPARISON:  None. FINDINGS: Lower chest: Lung bases are clear. Hepatobiliary: Focal fatty infiltration adjacent to the falciform ligament. No suspicious lesion. Gallbladder physiologically distended, no calcified stone. No biliary dilatation. Pancreas: No ductal dilatation or inflammation. Spleen: Tiny subcentimeter hypodensities in the medial spleen are too small to characterize. Normal in size. Small splenule inferiorly. Adrenals/Urinary Tract: Normal adrenal glands. No hydronephrosis or perinephric edema. Homogeneous renal enhancement. Urinary bladder is physiologically distended without wall thickening. Stomach/Bowel: Small hiatal hernia. Stomach is nondistended. Small bowel dilatation, inflammation, or obstruction. Normal appendix. Submucosal fatty infiltration throughout the colon suggesting  prior or chronic inflammation. No wall thickening or acute inflammatory change. Vascular/Lymphatic: No significant vascular findings are present. No enlarged abdominal or pelvic lymph nodes. Reproductive: Uterus and bilateral adnexa are unremarkable. Other: No free air, free fluid, or intra-abdominal fluid collection. Tiny fat containing umbilical hernia. Musculoskeletal: There are no acute or suspicious osseous abnormalities. Scattered bone islands in the pelvis. IMPRESSION: No acute abnormality or explanation for abdominal pain. Electronically Signed   By: Narda Rutherford M.D.   On: 04/20/2019 02:49    Procedures Procedures (including critical care time)  Medications Ordered in ED Medications  sodium chloride 0.9 % bolus 1,000 mL (0 mLs Intravenous Stopped 04/20/19 0002)  famotidine (PEPCID) IVPB 20 mg premix (0 mg Intravenous Stopped 04/19/19 2340)  ondansetron (ZOFRAN) injection 4 mg (4 mg Intravenous Given 04/19/19 2300)  potassium chloride SA (K-DUR) CR tablet 40 mEq (40 mEq Oral Given 04/20/19 0056)  promethazine (PHENERGAN) injection 25 mg (25 mg Intravenous Given 04/20/19 0016)  iohexol (OMNIPAQUE) 300 MG/ML solution 100 mL (100 mLs Intravenous Contrast Given 04/20/19 0224)  sodium chloride (PF) 0.9 % injection (  Given by Other 04/20/19 0321)  LORazepam (ATIVAN) injection 0.5 mg (0.5 mg Intravenous Given 04/20/19 0316)     Initial Impression / Assessment and Plan / ED Course  I have reviewed the triage vital signs and the nursing notes.  Pertinent labs & imaging results that were available during my care of the patient were reviewed by me and considered in my medical decision making (see chart for details).        Patient seen and examined.  Abdominal exam is reassuring.  We will treat symptoms with IV fluids and IV antiemetic.  Will check labs and reexamine.  Vital signs reviewed and are as follows: BP 116/69 (BP Location: Right Arm)   Pulse 70   Temp 97.8 F (36.6 C) (Oral)    Resp 14   LMP 04/06/2019 Comment: negative beta HCG 04/20/19  SpO2 98%   CT imaging reviewed by myself.  Patient updated on all results.  UA consistent with dehydration.  Potassium noted to be low and was repleted.  Patient was feeling better after administration of IV Phenergan, however she has no vomiting actively again.  Will give Ativan 0.5 mg IV for nausea and vomiting.  Will reassess.  EKG reviewed. This was ordered given chest burning and to evaluate for prolonged QT in case other antiemetics are required.   Patient was able to sleep for a little while. Will ambulate and recheck.   Patient plans to drink Pedialyte and use electrolyte powder upon returning home to help support potassium.  Patient states that she was able to ambulate.  She had an episode of dry heaving after walking however is comfortable with discharged home.  She has had Phenergan in the past without any difficulty and will be prescribed this.  She will follow-up with her primary care doctor to have her EKG rechecked.    The patient was urged to return to the Emergency Department immediately with worsening of current symptoms, worsening abdominal pain, persistent vomiting, blood noted in stools, fever, or any other concerns. The patient verbalized understanding.   Final Clinical Impressions(s) / ED Diagnoses   Final diagnoses:  Nausea vomiting and diarrhea  Hypokalemia  Prolonged Q-T interval on ECG   N/V/D: Suspect gastritis versus gastroenteritis.  Patient with elevated white blood cell count.  CT was obtained to rule out significant abdominal pathology and fortunately was negative for acute pathology.  Patient was treated with IV fluids and several rounds of antiemetics as well as Pepcid in the emergency department.  She felt better after treatment.  Potassium repleted.  Patient had EKG performed given hypokalemia, burning in chest.  Mild QT prolongation noted.  This may be due to medication she received in the  emergency department.  She will have this rechecked by her primary care doctor to ensure no congenital long QT syndrome.  Patient stable for discharged home at this time.  ED Discharge Orders         Ordered    promethazine (PHENERGAN) 25 MG tablet  Every 6 hours PRN     04/20/19 0550            Renne Crigler, PA-C 04/20/19 1610    Geoffery Lyons, MD 04/20/19 [phone]

## 2019-04-20 NOTE — ED Notes (Signed)
Pt refusing K+ at this time, due to nausea. States will take after she feels some better from Phenergan dose

## 2019-04-20 NOTE — Discharge Instructions (Signed)
Please read and follow all provided instructions.  Your diagnoses today include:  1. Nausea vomiting and diarrhea   2. Hypokalemia   3. Prolonged Q-T interval on ECG     Tests performed today include:  Blood counts and electrolytes  Blood tests to check liver and kidney function  Blood tests to check pancreas function  Urine test to look for infection and pregnancy (in women)  Vital signs. See below for your results today.   Medications prescribed:  Phenergan (promethazine) - for nausea and vomiting  Take any prescribed medications only as directed.  Home care instructions:   Follow any educational materials contained in this packet.   Your abdominal pain, nausea, vomiting, and diarrhea may be caused by a viral gastroenteritis also called 'stomach flu'. You should rest for the next several days. Keep drinking plenty of fluids and use the medicine for nausea as directed.    Drink clear liquids for the next 24 hours and introduce solid foods slowly after 24 hours using the b.r.a.t. diet (Bananas, Rice, Applesauce, Toast, Yogurt).    Follow-up instructions: Please follow-up with your primary care provider in the next 2 days for further evaluation of your symptoms. If you are not feeling better in 48 hours you may have a condition that is more serious and you need re-evaluation.   Return instructions:  SEEK IMMEDIATE MEDICAL ATTENTION IF:  If you have pain that does not go away or becomes severe   A temperature above 101F develops   Repeated vomiting occurs (multiple episodes)   If you have pain that becomes localized to portions of the abdomen. The right side could possibly be appendicitis. In an adult, the left lower portion of the abdomen could be colitis or diverticulitis.   Blood is being passed in stools or vomit (bright red or black tarry stools)   You develop chest pain, difficulty breathing, dizziness or fainting, or become confused, poorly responsive, or  inconsolable (young children)  If you have any other emergent concerns regarding your health  Additional Information: Abdominal (belly) pain can be caused by many things. Your caregiver performed an examination and possibly ordered blood/urine tests and imaging (CT scan, x-rays, ultrasound). Many cases can be observed and treated at home after initial evaluation in the emergency department. Even though you are being discharged home, abdominal pain can be unpredictable. Therefore, you need a repeated exam if your pain does not resolve, returns, or worsens. Most patients with abdominal pain don't have to be admitted to the hospital or have surgery, but serious problems like appendicitis and gallbladder attacks can start out as nonspecific pain. Many abdominal conditions cannot be diagnosed in one visit, so follow-up evaluations are very important.  Your vital signs today were: BP (!) 145/78    Pulse 74    Temp 97.8 F (36.6 C) (Oral)    Resp 14    LMP 04/06/2019 Comment: negative beta HCG 04/20/19   SpO2 100%  If your blood pressure (bp) was elevated above 135/85 this visit, please have this repeated by your doctor within one month. --------------

## 2019-04-21 ENCOUNTER — Encounter (HOSPITAL_COMMUNITY): Payer: Self-pay | Admitting: Emergency Medicine

## 2019-04-21 ENCOUNTER — Other Ambulatory Visit: Payer: Self-pay

## 2019-04-21 ENCOUNTER — Emergency Department (HOSPITAL_COMMUNITY)
Admission: EM | Admit: 2019-04-21 | Discharge: 2019-04-21 | Disposition: A | Discharge status: Home / Self Care | Payer: Commercial Managed Care - PPO | Attending: Emergency Medicine | Admitting: Emergency Medicine

## 2019-04-21 DIAGNOSIS — R070 Pain in throat: Secondary | ICD-10-CM | POA: Diagnosis not present

## 2019-04-21 DIAGNOSIS — J3489 Other specified disorders of nose and nasal sinuses: Secondary | ICD-10-CM | POA: Diagnosis not present

## 2019-04-21 DIAGNOSIS — F17210 Nicotine dependence, cigarettes, uncomplicated: Secondary | ICD-10-CM | POA: Diagnosis not present

## 2019-04-21 DIAGNOSIS — R112 Nausea with vomiting, unspecified: Secondary | ICD-10-CM | POA: Diagnosis not present

## 2019-04-21 DIAGNOSIS — Z79899 Other long term (current) drug therapy: Secondary | ICD-10-CM | POA: Insufficient documentation

## 2019-04-21 DIAGNOSIS — R197 Diarrhea, unspecified: Secondary | ICD-10-CM | POA: Diagnosis not present

## 2019-04-21 DIAGNOSIS — R1013 Epigastric pain: Secondary | ICD-10-CM | POA: Insufficient documentation

## 2019-04-21 DIAGNOSIS — F1290 Cannabis use, unspecified, uncomplicated: Secondary | ICD-10-CM | POA: Diagnosis not present

## 2019-04-21 DIAGNOSIS — J45909 Unspecified asthma, uncomplicated: Secondary | ICD-10-CM | POA: Insufficient documentation

## 2019-04-21 DIAGNOSIS — R079 Chest pain, unspecified: Secondary | ICD-10-CM | POA: Diagnosis not present

## 2019-04-21 DIAGNOSIS — R05 Cough: Secondary | ICD-10-CM | POA: Insufficient documentation

## 2019-04-21 DIAGNOSIS — E860 Dehydration: Secondary | ICD-10-CM | POA: Diagnosis not present

## 2019-04-21 LAB — COMPREHENSIVE METABOLIC PANEL
ALT: 24 U/L (ref 0–44)
AST: 26 U/L (ref 15–41)
Albumin: 4.2 g/dL (ref 3.5–5.0)
Alkaline Phosphatase: 52 U/L (ref 38–126)
Anion gap: 15 (ref 5–15)
BUN: 12 mg/dL (ref 6–20)
CO2: 24 mmol/L (ref 22–32)
Calcium: 9.3 mg/dL (ref 8.9–10.3)
Chloride: 98 mmol/L (ref 98–111)
Creatinine, Ser: 0.78 mg/dL (ref 0.44–1.00)
GFR calc Af Amer: >60 mL/min (ref >60)
GFR calc non Af Amer: >60 mL/min (ref >60)
Glucose, Bld: 115 mg/dL — ABNORMAL HIGH (ref 70–99)
Potassium: 3.1 mmol/L — ABNORMAL LOW (ref 3.5–5.1)
Sodium: 137 mmol/L (ref 135–145)
Total Bilirubin: 1.4 mg/dL — ABNORMAL HIGH (ref 0.3–1.2)
Total Protein: 7.5 g/dL (ref 6.5–8.1)

## 2019-04-21 LAB — CBC
HCT: 42.1 % (ref 36.0–46.0)
Hemoglobin: 14.6 g/dL (ref 12.0–15.0)
MCH: 34.4 pg — ABNORMAL HIGH (ref 26.0–34.0)
MCHC: 34.7 g/dL (ref 30.0–36.0)
MCV: 99.3 fL (ref 80.0–100.0)
Platelets: 294 10*3/uL (ref 150–400)
RBC: 4.24 MIL/uL (ref 3.87–5.11)
RDW: 13.3 % (ref 11.5–15.5)
WBC: 16.5 10*3/uL — ABNORMAL HIGH (ref 4.0–10.5)
nRBC: 0 % (ref 0.0–0.2)

## 2019-04-21 LAB — URINALYSIS, ROUTINE W REFLEX MICROSCOPIC
Bilirubin Urine: NEGATIVE
Glucose, UA: NEGATIVE mg/dL
Ketones, ur: 80 mg/dL — AB
Nitrite: NEGATIVE
Protein, ur: 30 mg/dL — AB
Specific Gravity, Urine: 1.025 (ref 1.005–1.030)
pH: 5 (ref 5.0–8.0)

## 2019-04-21 LAB — RAPID URINE DRUG SCREEN, HOSP PERFORMED
Amphetamines: NOT DETECTED
Barbiturates: NOT DETECTED
Benzodiazepines: POSITIVE — AB
Cocaine: NOT DETECTED
Opiates: NOT DETECTED
Tetrahydrocannabinol: POSITIVE — AB

## 2019-04-21 LAB — PREGNANCY, URINE: Preg Test, Ur: NEGATIVE

## 2019-04-21 LAB — LIPASE, BLOOD: Lipase: 22 U/L (ref 11–51)

## 2019-04-21 LAB — I-STAT BETA HCG BLOOD, ED (MC, WL, AP ONLY): I-stat hCG, quantitative: 8.9 m[IU]/mL — ABNORMAL HIGH (ref <5)

## 2019-04-21 MED ORDER — PROCHLORPERAZINE EDISYLATE 10 MG/2ML IJ SOLN
10.0000 mg | Freq: Once | INTRAMUSCULAR | Status: AC
  Administered 2019-04-21: 08:00:00 10 mg via INTRAVENOUS
  Filled 2019-04-21: qty 2

## 2019-04-21 MED ORDER — FAMOTIDINE IN NACL 20-0.9 MG/50ML-% IV SOLN
20.0000 mg | Freq: Once | INTRAVENOUS | Status: AC
  Administered 2019-04-21: 08:00:00 20 mg via INTRAVENOUS
  Filled 2019-04-21: qty 50

## 2019-04-21 MED ORDER — SODIUM CHLORIDE 0.9% FLUSH: 3.0000 mL | Freq: Once | INTRAVENOUS | Status: DC

## 2019-04-21 MED ORDER — DIPHENHYDRAMINE HCL 50 MG/ML IJ SOLN
12.5000 mg | Freq: Once | INTRAMUSCULAR | Status: AC
  Administered 2019-04-21: 08:00:00 12.5 mg via INTRAVENOUS
  Filled 2019-04-21: qty 1

## 2019-04-21 MED ORDER — POTASSIUM CHLORIDE 10 MEQ/100ML IV SOLN
10.0000 meq | Freq: Once | INTRAVENOUS | Status: AC
  Administered 2019-04-21: 10:00:00 10 meq via INTRAVENOUS
  Filled 2019-04-21: qty 100

## 2019-04-21 MED ORDER — SODIUM CHLORIDE 0.9 % IV BOLUS
1000.0000 mL | Freq: Once | INTRAVENOUS | Status: AC
  Administered 2019-04-21: 08:00:00 1000 mL via INTRAVENOUS

## 2019-04-21 NOTE — ED Notes (Signed)
Pt notified of needed urine sample 

## 2019-04-21 NOTE — ED Notes (Signed)
EKG given to EDP, Ward,MD., for review. 

## 2019-04-21 NOTE — ED Notes (Signed)
Pt ambulated to bathroom without assistance 

## 2019-04-21 NOTE — Discharge Instructions (Addendum)
You were seen in the ED for continued nausea, vomiting.  You felt a lot better in the ED and you were able to keep fluids down without vomiting.  Your potassium was low but this was repleted.  Your urine showed dehydration.  I suspect your symptoms may have been from recent food intake or possibly gastritis.   Initial hCG pregnancy test was 8.9 but urine pregnancy test was negative.  It is very unlikely that you are pregnant.  If you are still concerned, follow-up with your primary care doctor in 48 hours for further discussion of this.  Urine tested positive for marijuana, consider stopping this as this contributes to intractable nausea and vomiting and abdominal pain.  Also avoid alcohol, tobacco use, ibuprofen and aspirin containing products as these also irritate stomach.   Start taking omeprazole 40 mg every morning on empty stomach before eating or drinking.  Take famotidine 20 mg in the morning and at night.  Sucralfate suspension 20 minutes before meals and at nighttime to help with acid production and stomach irritation.  Return to the ED for persistent or return of symptoms despite medicines, fever, chest pain or shortness of breath, blood in your stool, black vomit or stools.

## 2019-04-21 NOTE — ED Provider Notes (Signed)
[facility] DEPT Provider Note   CSN: [id number] Arrival date & time: 04/21/19  8242    History   Chief Complaint Chief Complaint  Patient presents with   Chest Pain   Emesis   Nausea    HPI Meagan Mason is a 38 y.o. female with history of asthma, anxiety, chronic neck pain is here for evaluation of persistent nausea associated with vomiting, diarrhea, epigastric abdominal pain.  Onset 2 days ago.  The night before her symptoms began she had a large meal of pulled pork, mac & cheese and beer.  She felt like the sour beer tasted a little off.  Her symptoms began suddenly around 4 AM the following morning.  This is her fourth ED visit in the last 2 days for the same.  She was seen yesterday and had a CT A/P that was normal.  Her white count was 23K.  States she had persistent nausea and vomiting in the ED, after discharge.  Her nausea and vomiting began again at 5 AM.  She took Zofran at that time without relief.  Approximate 20-30 episodes of emesis since 5 AM.  Last nonbloody non-melanotic diarrhea yesterday morning.  Emesis has had some streaks of bright red blood a few times.  Has some runny nose and dry cough that she attributes from all the vomiting, underlying allergies.  Epigastric abdominal pain is burning in quality and constant, significantly worse with active emesis.  She has burning sensation in her chest and in the back of her throat as well.  Occasionally able to keep some sips of water down, biting down on ice makes her more nauseated.  Remember several years ago she had a bout of gastritis, at that time she had negative H. pylori test.  For the last few weeks during the pandemic she has been drinking at least 2 EtOH drinks nightly.  Positive tobacco use.  Occasional marijuana use.  NSAID use as needed for chronic neck pain but none recently.  She denies any fever, productive cough, chest pain or shortness of breath with exertion, dysuria.  No sick  contacts.  No recent travel.  No recent antibiotics.       HPI  Past Medical History:  Diagnosis Date   Allergy    Asthma    states only when stressed out   History of bronchitis    in college   Neck pain    ruptured disc in neck   URI (upper respiratory infection)    early Nov 2014   Weakness    numbness in left arm    Patient Active Problem List   Diagnosis Date Noted   Acute upper respiratory infection 10/30/2018   Head congestion 10/30/2018   Sore throat (viral) 10/30/2018   Gastritis 02/06/2016   Tobacco abuse 02/06/2016   Health care maintenance 02/06/2016    Past Surgical History:  Procedure Laterality Date   WISDOM TOOTH EXTRACTION       OB History   No obstetric history on file.      Home Medications    Prior to Admission medications   Medication Sig Start Date End Date Taking? Authorizing Provider  albuterol (PROVENTIL HFA;VENTOLIN HFA) 108 (90 BASE) MCG/ACT inhaler Inhale 2 puffs into the lungs every 6 (six) hours as needed for wheezing or shortness of breath.    Yes [provider]  loratadine (CLARITIN) 10 MG tablet Take 10 mg by mouth daily.   Yes [provider]  ondansetron (  ZOFRAN) 4 MG tablet Take 1 tablet (4 mg total) by mouth every 8 (eight) hours as needed for nausea or vomiting. 04/19/19  Yes Lamptey, Myrene Galas, MD  promethazine (PHENERGAN) 25 MG tablet Take 1 tablet (25 mg total) by mouth every 6 (six) hours as needed for nausea or vomiting. 04/20/19  Yes Carlisle Cater, PA-C  hydrOXYzine (ATARAX/VISTARIL) 25 MG tablet Take 1 tablet (25 mg total) by mouth every 8 (eight) hours as needed for anxiety. 04/19/19   Chase Picket, MD  sertraline (ZOLOFT) 50 MG tablet Take 1 tablet (50 mg total) by mouth daily. 04/19/19   Lamptey, Myrene Galas, MD    Family History Family History  Problem Relation Age of Onset   Heart disease Mother    Hypertension Mother    Heart attack Mother 52   Early death Father     Cancer Father 63       stomach cancer   Stroke Father    Heart attack Father        After stroke   Breast cancer Paternal Grandmother 35    Social History Social History   Tobacco Use   Smoking status: Current Every Day Smoker    Packs/day: 0.25    Years: 10.00    Pack years: 2.50    Types: Cigarettes   Smokeless tobacco: Never Used  Substance Use Topics   Alcohol use: Yes    Comment: wine 2-3 times a week   Drug use: No     Allergies   Other and Benadryl [diphenhydramine]   Review of Systems Review of Systems  HENT: Positive for rhinorrhea.   Gastrointestinal: Positive for abdominal pain, diarrhea, nausea and vomiting.  All other systems reviewed and are negative.    Physical Exam Updated Vital Signs BP (!) 143/81    Pulse 68    Temp 98.6 F (37 C) (Oral)    Resp 16    Ht _0  (1.626 m)    Wt 70.3 kg    LMP 04/06/2019 Comment: negative beta HCG 04/20/19   SpO2 96%    BMI 26.61 kg/m   Physical Exam Vitals signs and nursing note reviewed.  Constitutional:      Appearance: She is well-developed.     Comments: Nontoxic, pleasant.  Emesis bag with approximately 300 cc nonbloody emesis.  HENT:     Head: Normocephalic and atraumatic.     Comments: Moist mucous membranes.    Nose: Nose normal.  Eyes:     Conjunctiva/sclera: Conjunctivae normal.  Neck:     Musculoskeletal: Normal range of motion.  Cardiovascular:     Rate and Rhythm: Normal rate and regular rhythm.  Pulmonary:     Effort: Pulmonary effort is normal.     Breath sounds: Normal breath sounds.  Abdominal:     General: Bowel sounds are normal.     Palpations: Abdomen is soft.     Tenderness: There is no abdominal tenderness.     Comments: Patient talking throughout abdominal exam, there was no obvious discomfort with epigastric palpation.  NTND. No G/R/R. No suprapubic or CVA tenderness. Negative Murphy's and McBurney's. Active BS to lower quadrants.   Musculoskeletal: Normal range of  motion.  Skin:    General: Skin is warm and dry.     Capillary Refill: Capillary refill takes less than 2 seconds.  Neurological:     Mental Status: She is alert.  Psychiatric:        Behavior: Behavior normal.  ED Treatments / Results  Labs (all labs ordered are listed, but only abnormal results are displayed) Labs Reviewed  COMPREHENSIVE METABOLIC PANEL - Abnormal; Notable for the following components:      Result Value   Potassium 3.1 (*)    Glucose, Bld 115 (*)    Total Bilirubin 1.4 (*)    All other components within normal limits  CBC - Abnormal; Notable for the following components:   WBC 16.5 (*)    MCH 34.4 (*)    All other components within normal limits  URINALYSIS, ROUTINE W REFLEX MICROSCOPIC - Abnormal; Notable for the following components:   APPearance HAZY (*)    Hgb urine dipstick SMALL (*)    Ketones, ur 80 (*)    Protein, ur 30 (*)    Leukocytes,Ua TRACE (*)    Bacteria, UA MANY (*)    All other components within normal limits  RAPID URINE DRUG SCREEN, HOSP PERFORMED - Abnormal; Notable for the following components:   Benzodiazepines POSITIVE (*)    Tetrahydrocannabinol POSITIVE (*)    All other components within normal limits  I-STAT BETA HCG BLOOD, ED ([facility], [facility], [facility] ONLY) - Abnormal; Notable for the following components:   I-stat hCG, quantitative 8.9 (*)    All other components within normal limits  LIPASE, BLOOD  PREGNANCY, URINE  POC URINE PREG, ED    EKG EKG Interpretation  Date/Time:  Saturday Apr 21 2019 06:59:42 EDT Ventricular Rate:  65 PR Interval:    QRS Duration: 103 QT Interval:  430 QTC Calculation: 448 R Axis:   70 Text Interpretation:  Sinus rhythm Borderline short PR interval Probable left ventricular hypertrophy Prolonged QT interval has resolved Compared to previous tracing Confirmed by Pryor Curia [phone]) on 04/21/2019 7:04:52 AM   Radiology Ct Abdomen Pelvis W Contrast  Result Date: 04/20/2019 CLINICAL DATA:   Acute abdominal pain. Nausea and vomiting. EXAM: CT ABDOMEN AND PELVIS WITH CONTRAST TECHNIQUE: Multidetector CT imaging of the abdomen and pelvis was performed using the standard protocol following bolus administration of intravenous contrast. CONTRAST:  167m OMNIPAQUE IOHEXOL 300 MG/ML  SOLN COMPARISON:  None. FINDINGS: Lower chest: Lung bases are clear. Hepatobiliary: Focal fatty infiltration adjacent to the falciform ligament. No suspicious lesion. Gallbladder physiologically distended, no calcified stone. No biliary dilatation. Pancreas: No ductal dilatation or inflammation. Spleen: Tiny subcentimeter hypodensities in the medial spleen are too small to characterize. Normal in size. Small splenule inferiorly. Adrenals/Urinary Tract: Normal adrenal glands. No hydronephrosis or perinephric edema. Homogeneous renal enhancement. Urinary bladder is physiologically distended without wall thickening. Stomach/Bowel: Small hiatal hernia. Stomach is nondistended. Small bowel dilatation, inflammation, or obstruction. Normal appendix. Submucosal fatty infiltration throughout the colon suggesting prior or chronic inflammation. No wall thickening or acute inflammatory change. Vascular/Lymphatic: No significant vascular findings are present. No enlarged abdominal or pelvic lymph nodes. Reproductive: Uterus and bilateral adnexa are unremarkable. Other: No free air, free fluid, or intra-abdominal fluid collection. Tiny fat containing umbilical hernia. Musculoskeletal: There are no acute or suspicious osseous abnormalities. Scattered bone islands in the pelvis. IMPRESSION: No acute abnormality or explanation for abdominal pain. Electronically Signed   By: MKeith RakeM.D.   On: 04/20/2019 02:49    Procedures Procedures (including critical care time)  Medications Ordered in ED Medications  sodium chloride flush (NS) 0.9 % injection 3 mL (has no administration in time range)  famotidine (PEPCID) IVPB 20 mg premix (0  mg Intravenous Stopped 04/21/19 0928)  sodium chloride 0.9 % bolus 1,000 mL (  0 mLs Intravenous Stopped 04/21/19 0928)  prochlorperazine (COMPAZINE) injection 10 mg (10 mg Intravenous Given 04/21/19 0753)  diphenhydrAMINE (BENADRYL) injection 12.5 mg (12.5 mg Intravenous Given 04/21/19 0753)  potassium chloride 10 mEq in 100 mL IVPB (0 mEq Intravenous Stopped 04/21/19 1206)     Initial Impression / Assessment and Plan / ED Course  I have reviewed the triage vital signs and the nursing notes.  Pertinent labs & imaging results that were available during my care of the patient were reviewed by me and considered in my medical decision making (see chart for details).  Clinical Course as of Apr 21 1343  Sat Apr 21, 2019  0900 WBC(!): 16.5 [CG]  0900 Potassium(!): 3.1 [CG]  0900 I-stat hCG, quantitative(!): 8.9 [CG]  0930 Re-evaluated patient. No emesis since antiemetics given and since last evaluation.  Feels slightly better. Pending UA   [CG]  1000 Ketones, ur(!): 80 [CG]  1000 Leukocytes,Ua(!): TRACE [CG]  1000 WBC, UA: 0-5 [CG]  1000 Bacteria, UA(!): MANY [CG]  1004 Tetrahydrocannabinol(!): POSITIVE [CG]  1004 Benzodiazepines(!): POSITIVE [CG]    Clinical Course User Index [CG] Kinnie Feil, PA-C      I have reviewed all recent ED/UC visits in the last couple of days.  I have reviewed recent lab work and CT imaging myself remarkable for WBC 16.9>23.6. Hemoconcentration. K 3.0. AG 19. Ketonuria.  High suspicion for acute on chronic gastritis given recent meal, EtOH use, tobacco use.  Marijuana use may also be contributing, cannabinoid hyperemesis syndrome.  Has had negative H. pylori test.  Differential diagnosis also includes viral process.  Given chronicity of symptoms, reassuring abdominal exam today, lack of fever I have lower suspicion for development of acute life-threatening intra-abdominal process such as pancreatitis, cholecystitis, appendicitis, diverticulitis, SBO, perforated  viscus.  Will obtain labs, initiate symptom control.  Final Clinical Impressions(s) / ED Diagnoses   Work up today as above, mostly improved from last ED visit including WBC. UA suggestive of dehydration, +THC. hcg elevated but negative urine preg. Symptoms controlled with antiemetics without repeat emesis in ED since arrival.  Given improving labs, exam and symptom improvement do not thin further emergent lab/imaging indicated today. Will dc with PPI, famotidine, antiemetics.  Return precautions given. Pt was comfortable with this.  Final diagnoses:  Vomiting with nausea, not intractable  Marijuana use  Dehydration    ED Discharge Orders    None       Kinnie Feil, PA-C 04/21/19 1344    Julianne Rice, MD 04/21/19 1416

## 2019-04-21 NOTE — ED Triage Notes (Signed)
Patient reports epigastric pain/pressure and sob after having n/v since Wednesday morning. Pt states she hasn't been able to keep anything down. Pt states she has been here 3 time in last 24hrs. Pt states she left the second before being seen. Pt states she feels like she has a hot poker in her throat. Pt was given zofran and phenergan but states they only work about 30 minutes and then she starts vomiting again.

## 2019-04-23 DIAGNOSIS — O3680X9 Pregnancy with inconclusive fetal viability, other fetus: Secondary | ICD-10-CM | POA: Diagnosis not present

## 2019-04-24 ENCOUNTER — Encounter: Payer: Self-pay | Admitting: Internal Medicine

## 2019-04-24 ENCOUNTER — Ambulatory Visit: Payer: Commercial Managed Care - PPO | Admitting: Internal Medicine

## 2019-04-24 ENCOUNTER — Other Ambulatory Visit: Payer: Self-pay

## 2019-04-24 VITALS — BP 150/100 | HR 116 | Temp 98.4°F | Ht 64.0 in | Wt 146.1 lb

## 2019-04-24 DIAGNOSIS — F419 Anxiety disorder, unspecified: Secondary | ICD-10-CM

## 2019-04-24 DIAGNOSIS — N3000 Acute cystitis without hematuria: Secondary | ICD-10-CM | POA: Diagnosis not present

## 2019-04-24 DIAGNOSIS — R112 Nausea with vomiting, unspecified: Secondary | ICD-10-CM

## 2019-04-24 DIAGNOSIS — F411 Generalized anxiety disorder: Secondary | ICD-10-CM | POA: Insufficient documentation

## 2019-04-24 DIAGNOSIS — F410 Panic disorder [episodic paroxysmal anxiety] without agoraphobia: Secondary | ICD-10-CM | POA: Insufficient documentation

## 2019-04-24 MED ORDER — CIPROFLOXACIN HCL 500 MG PO TABS: 500.0000 mg | ORAL_TABLET | Freq: Two times a day (BID) | ORAL | 0 refills | Status: AC

## 2019-04-24 NOTE — Patient Instructions (Signed)
-Hope you feel better!  -Start cipro 500 mg BID for 7 days.  -Continue carafate and prilosec.  -Return if no improvement in 2 weeks.  -Drink plenty of fluids to stay hydrated.   Urinary Tract Infection, Adult A urinary tract infection (UTI) is an infection of any part of the urinary tract. The urinary tract includes:  The kidneys.  The ureters.  The bladder.  The urethra. These organs make, store, and get rid of pee (urine) in the body. What are the causes? This is caused by germs (bacteria) in your genital area. These germs grow and cause swelling (inflammation) of your urinary tract. What increases the risk? You are more likely to develop this condition if:  You have a small, thin tube (catheter) to drain pee.  You cannot control when you pee or poop (incontinence).  You are female, and: ? You use these methods to prevent pregnancy: ? A medicine that kills sperm (spermicide). ? A device that blocks sperm (diaphragm). ? You have low levels of a female hormone (estrogen). ? You are pregnant.  You have genes that add to your risk.  You are sexually active.  You take antibiotic medicines.  You have trouble peeing because of: ? A prostate that is bigger than normal, if you are female. ? A blockage in the part of your body that drains pee from the bladder (urethra). ? A kidney stone. ? A nerve condition that affects your bladder (neurogenic bladder). ? Not getting enough to drink. ? Not peeing often enough.  You have other conditions, such as: ? Diabetes. ? A weak disease-fighting system (immune system). ? Sickle cell disease. ? Gout. ? Injury of the spine. What are the signs or symptoms? Symptoms of this condition include:  Needing to pee right away (urgently).  Peeing often.  Peeing small amounts often.  Pain or burning when peeing.  Blood in the pee.  Pee that smells bad or not like normal.  Trouble peeing.  Pee that is cloudy.  Fluid coming  from the vagina, if you are female.  Pain in the belly or lower back. Other symptoms include:  Throwing up (vomiting).  No urge to eat.  Feeling mixed up (confused).  Being tired and grouchy (irritable).  A fever.  Watery poop (diarrhea). How is this treated? This condition may be treated with:  Antibiotic medicine.  Other medicines.  Drinking enough water. Follow these instructions at home:  Medicines  Take over-the-counter and prescription medicines only as told by your doctor.  If you were prescribed an antibiotic medicine, take it as told by your doctor. Do not stop taking it even if you start to feel better. General instructions  Make sure you: ? Pee until your bladder is empty. ? Do not hold pee for a long time. ? Empty your bladder after sex. ? Wipe from front to back after pooping if you are a female. Use each tissue one time when you wipe.  Drink enough fluid to keep your pee pale yellow.  Keep all follow-up visits as told by your doctor. This is important. Contact a doctor if:  You do not get better after 1-2 days.  Your symptoms go away and then come back. Get help right away if:  You have very bad back pain.  You have very bad pain in your lower belly.  You have a fever.  You are sick to your stomach (nauseous).  You are throwing up. Summary  A urinary tract infection (UTI)  is an infection of any part of the urinary tract.  This condition is caused by germs in your genital area.  There are many risk factors for a UTI. These include having a small, thin tube to drain pee and not being able to control when you pee or poop.  Treatment includes antibiotic medicines for germs.  Drink enough fluid to keep your pee pale yellow. This information is not intended to replace advice given to you by your health care provider. Make sure you discuss any questions you have with your health care provider. Document Released: 05/10/2008 Document  Revised: 06/01/2018 Document Reviewed: 06/01/2018 Elsevier Interactive Patient Education  2019 [employer].

## 2019-04-24 NOTE — Progress Notes (Signed)
New Patient Office Visit     CC/Reason for Visit: N/V Previous PCP: none Last Visit: unknown  HPI: Meagan Mason is a 38 y.o. female who is coming in today for the above mentioned reasons. No PMH of significance. She has visited UC/ED 4 times in the past week due to her symptoms. Exactly 1 week ago she had mac and cheese, brisket and beer for dinner. She had almost immediate nausea and vomiting. Because her symptoms did not improve and she felt she was getting dehydrated, she went to UC where she was given some IVF and sent home. She continued to have issues. During one of her visits she was found to have a WBC count of 23. U preg was negative but serum HCG was a little high at 8.9. During her last ED visit on 5/16, WBCs had dropped to 16.9, CT A/P was normal, K was 3.1 and she had a prolonged QT on EKG. She was given K supplements, fluids, prilosec, carafate, phenergan and sent home. She had no abdominal pain to begin with but now has severe burning of her chest, states carafate seems to help with this. She saw an OB/GYN and repeat serum HCG was negative at <3.0. She has not vomited since yesterday. She has anxiety and was recently given a Rx for zoloft 50 mg that she has not yet started. She was also found to have BP in office today of 150/100; no prior h/o HTN. She smokes THC and her UDS in the ED was positive for it. She also had many bacteria in urine, was not given abx. She has had mild dysuria and urinary frequency.    Past Medical/Surgical History: Past Medical History:  Diagnosis Date  . Allergy   . Asthma    states only when stressed out  . History of bronchitis    in college  . Neck pain    ruptured disc in neck  . URI (upper respiratory infection)    early Nov 2014  . Weakness    numbness in left arm    Past Surgical History:  Procedure Laterality Date  . WISDOM TOOTH EXTRACTION      Social History:  reports that she has been smoking cigarettes. She has a 2.50  pack-year smoking history. She has never used smokeless tobacco. She reports current alcohol use. She reports that she does not use drugs.  Allergies: Allergies  Allergen Reactions  . Other Shortness Of Breath and Itching    Pecans, tree nuts   . Benadryl [Diphenhydramine] Other (See Comments)    hyperactivity    Family History:  Family History  Problem Relation Age of Onset  . Heart disease Mother   . Hypertension Mother   . Heart attack Mother 77  . Early death Father   . Cancer Father 32       stomach cancer  . Stroke Father   . Heart attack Father        After stroke  . Breast cancer Paternal Grandmother 37     Current Outpatient Medications:  .  albuterol (PROVENTIL HFA;VENTOLIN HFA) 108 (90 BASE) MCG/ACT inhaler, Inhale 2 puffs into the lungs every 6 (six) hours as needed for wheezing or shortness of breath. , Disp: , Rfl:  .  omeprazole (PRILOSEC) 20 MG capsule, Take 20 mg by mouth 2 (two) times daily before a meal., Disp: , Rfl:  .  ondansetron (ZOFRAN) 4 MG tablet, Take 1 tablet (4 mg total) by mouth  every 8 (eight) hours as needed for nausea or vomiting., Disp: 20 tablet, Rfl: 0 .  promethazine (PHENERGAN) 25 MG tablet, Take 1 tablet (25 mg total) by mouth every 6 (six) hours as needed for nausea or vomiting., Disp: 10 tablet, Rfl: 0 .  Sucralfate (CARAFATE PO), Take by mouth. Omeprazole and sodium bicarbonate - every 6 hours, Disp: , Rfl:  .  ciprofloxacin (CIPRO) 500 MG tablet, Take 1 tablet (500 mg total) by mouth 2 (two) times daily for 7 days., Disp: 14 tablet, Rfl: 0 .  hydrOXYzine (ATARAX/VISTARIL) 25 MG tablet, Take 1 tablet (25 mg total) by mouth every 8 (eight) hours as needed for anxiety. (Patient not taking: Reported on 04/24/2019), Disp: 12 tablet, Rfl: 0 .  loratadine (CLARITIN) 10 MG tablet, Take 10 mg by mouth daily., Disp: , Rfl:  .  sertraline (ZOLOFT) 50 MG tablet, Take 1 tablet (50 mg total) by mouth daily. (Patient not taking: Reported on 04/24/2019),  Disp: 30 tablet, Rfl: 1  Review of Systems:  Constitutional: Denies fever, chills, diaphoresis. HEENT: Denies photophobia, eye pain, redness, hearing loss, ear pain, congestion, sore throat, rhinorrhea, sneezing, mouth sores, trouble swallowing, neck pain, neck stiffness and tinnitus.   Respiratory: Denies SOB, DOE, cough, chest tightness,  and wheezing.   Cardiovascular: Denies chest pain, palpitations and leg swelling.  Gastrointestinal: Denies  diarrhea, constipation, blood in stool and abdominal distention.  Genitourinary: Denies dysuria, urgency, frequency, hematuria, flank pain and difficulty urinating.  Endocrine: Denies: hot or cold intolerance, sweats, changes in hair or nails, polyuria, polydipsia. Musculoskeletal: Denies myalgias, back pain, joint swelling, arthralgias and gait problem.  Skin: Denies pallor, rash and wound.  Neurological: Denies dizziness, seizures, syncope, light-headedness, numbness and headaches.  Hematological: Denies adenopathy. Easy bruising, personal or family bleeding history  Psychiatric/Behavioral: Denies suicidal ideation, mood changes, confusion, nervousness, sleep disturbance and agitation    Physical Exam: Vitals:   04/24/19 1534  BP: (!) 150/100  Pulse: (!) 116  Temp: 98.4 F (36.9 C)  TempSrc: Oral  SpO2: 96%  Weight: 146 lb 1.6 oz (66.3 kg)  Height: _0  (1.626 m)   Body mass index is 25.08 kg/m.  Constitutional: NAD, calm, comfortable Eyes: PERRL, lids and conjunctivae normal ENMT: Mucous membranes are dry.  Abdomen: no tenderness, no masses palpated. No hepatosplenomegaly. Bowel sounds positive.  Musculoskeletal: no clubbing / cyanosis. No joint deformity upper and lower extremities. Good ROM, no contractures. Normal muscle tone.  Psychiatric: Normal judgment and insight. Alert and oriented x 3. Normal mood.    Impression and Plan:  Acute cystitis without hematuria Nausea and vomiting, intractability of vomiting not  specified, unspecified vomiting type -With bacteriuria and symptoms, I believe we need to treat her for a UTI. -Cipro for 7 days. -Continue prilosec and carafate as she has likely developed some gastritis from prolonged emesis. -She is advised to drink plenty of fluids to stay hydrated. -Consider GI referral/repeat labs if no improvement in 2 weeks. -THC could also be causing cyclic vomiting, we have discussed cessation of use. -Repeat serum HCG was negative with OB/GYN (I have seen the report she has brought in).  Anxiety -Advised to start prescribed zoloft and continue CBT, which she is already doing.     Patient Instructions  -Hope you feel better!  -Start cipro 500 mg BID for 7 days.  -Continue carafate and prilosec.  -Return if no improvement in 2 weeks.  -Drink plenty of fluids to stay hydrated.   Urinary Tract Infection, Adult A  urinary tract infection (UTI) is an infection of any part of the urinary tract. The urinary tract includes:  The kidneys.  The ureters.  The bladder.  The urethra. These organs make, store, and get rid of pee (urine) in the body. What are the causes? This is caused by germs (bacteria) in your genital area. These germs grow and cause swelling (inflammation) of your urinary tract. What increases the risk? You are more likely to develop this condition if:  You have a small, thin tube (catheter) to drain pee.  You cannot control when you pee or poop (incontinence).  You are female, and: ? You use these methods to prevent pregnancy: ? A medicine that kills sperm (spermicide). ? A device that blocks sperm (diaphragm). ? You have low levels of a female hormone (estrogen). ? You are pregnant.  You have genes that add to your risk.  You are sexually active.  You take antibiotic medicines.  You have trouble peeing because of: ? A prostate that is bigger than normal, if you are female. ? A blockage in the part of your body that drains  pee from the bladder (urethra). ? A kidney stone. ? A nerve condition that affects your bladder (neurogenic bladder). ? Not getting enough to drink. ? Not peeing often enough.  You have other conditions, such as: ? Diabetes. ? A weak disease-fighting system (immune system). ? Sickle cell disease. ? Gout. ? Injury of the spine. What are the signs or symptoms? Symptoms of this condition include:  Needing to pee right away (urgently).  Peeing often.  Peeing small amounts often.  Pain or burning when peeing.  Blood in the pee.  Pee that smells bad or not like normal.  Trouble peeing.  Pee that is cloudy.  Fluid coming from the vagina, if you are female.  Pain in the belly or lower back. Other symptoms include:  Throwing up (vomiting).  No urge to eat.  Feeling mixed up (confused).  Being tired and grouchy (irritable).  A fever.  Watery poop (diarrhea). How is this treated? This condition may be treated with:  Antibiotic medicine.  Other medicines.  Drinking enough water. Follow these instructions at home:  Medicines  Take over-the-counter and prescription medicines only as told by your doctor.  If you were prescribed an antibiotic medicine, take it as told by your doctor. Do not stop taking it even if you start to feel better. General instructions  Make sure you: ? Pee until your bladder is empty. ? Do not hold pee for a long time. ? Empty your bladder after sex. ? Wipe from front to back after pooping if you are a female. Use each tissue one time when you wipe.  Drink enough fluid to keep your pee pale yellow.  Keep all follow-up visits as told by your doctor. This is important. Contact a doctor if:  You do not get better after 1-2 days.  Your symptoms go away and then come back. Get help right away if:  You have very bad back pain.  You have very bad pain in your lower belly.  You have a fever.  You are sick to your stomach  (nauseous).  You are throwing up. Summary  A urinary tract infection (UTI) is an infection of any part of the urinary tract.  This condition is caused by germs in your genital area.  There are many risk factors for a UTI. These include having a small, thin tube to drain pee  and not being able to control when you pee or poop.  Treatment includes antibiotic medicines for germs.  Drink enough fluid to keep your pee pale yellow. This information is not intended to replace advice given to you by your health care provider. Make sure you discuss any questions you have with your health care provider. Document Released: 05/10/2008 Document Revised: 06/01/2018 Document Reviewed: 06/01/2018 Elsevier Interactive Patient Education  2019 [place], MD [facility]

## 2019-05-10 ENCOUNTER — Encounter: Payer: Self-pay | Admitting: Internal Medicine

## 2019-05-11 ENCOUNTER — Ambulatory Visit (INDEPENDENT_AMBULATORY_CARE_PROVIDER_SITE_OTHER): Payer: Commercial Managed Care - PPO | Admitting: Internal Medicine

## 2019-05-11 ENCOUNTER — Other Ambulatory Visit: Payer: Self-pay

## 2019-05-11 DIAGNOSIS — F419 Anxiety disorder, unspecified: Secondary | ICD-10-CM | POA: Diagnosis not present

## 2019-05-11 NOTE — Progress Notes (Signed)
Virtual Visit via Video Note  I connected with Meagan Mason on 05/11/19 at  3:00 PM EDT by a video enabled telemedicine application and verified that I am speaking with the correct person using two identifiers.  Location patient: home Location provider: work office Persons participating in the virtual visit: patient, provider  I discussed the limitations of evaluation and management by telemedicine and the availability of in person appointments. The patient expressed understanding and agreed to proceed.   HPI: She has scheduled this visit to discuss her anxiety.  I saw for the first time about 3 weeks ago for persistent nausea and vomiting which completely resolved after being treated for GERD and a UTI.  She is no longer taking Prilosec or Carafate.  While at urgent care she was prescribed some Zoloft for anxiety and she wanted to discuss this with me before she started.  She does see a therapist every Friday, she admits that crowded places bother her because she feels overstimulated.  Her therapist has talked to her about starting treatment for anxiety for a long time.   ROS: Constitutional: Denies fever, chills, diaphoresis, appetite change and fatigue.  HEENT: Denies photophobia, eye pain, redness, hearing loss, ear pain, congestion, sore throat, rhinorrhea, sneezing, mouth sores, trouble swallowing, neck pain, neck stiffness and tinnitus.   Respiratory: Denies SOB, DOE, cough, chest tightness,  and wheezing.   Cardiovascular: Denies chest pain, palpitations and leg swelling.  Gastrointestinal: Denies nausea, vomiting, abdominal pain, diarrhea, constipation, blood in stool and abdominal distention.  Genitourinary: Denies dysuria, urgency, frequency, hematuria, flank pain and difficulty urinating.  Endocrine: Denies: hot or cold intolerance, sweats, changes in hair or nails, polyuria, polydipsia. Musculoskeletal: Denies myalgias, back pain, joint swelling, arthralgias and gait  problem.  Skin: Denies pallor, rash and wound.  Neurological: Denies dizziness, seizures, syncope, weakness, light-headedness, numbness and headaches.  Hematological: Denies adenopathy. Easy bruising, personal or family bleeding history  Psychiatric/Behavioral: Denies suicidal ideation, mood changes, confusion, nervousness, sleep disturbance and agitation   Past Medical History:  Diagnosis Date   Allergy    Asthma    states only when stressed out   History of bronchitis    in college   Neck pain    ruptured disc in neck   URI (upper respiratory infection)    early Nov 2014   Weakness    numbness in left arm    Past Surgical History:  Procedure Laterality Date   WISDOM TOOTH EXTRACTION      Family History  Problem Relation Age of Onset   Heart disease Mother    Hypertension Mother    Heart attack Mother 61   Early death Father    Cancer Father 75       stomach cancer   Stroke Father    Heart attack Father        After stroke   Breast cancer Paternal Grandmother 52    SOCIAL HX:   reports that she has been smoking cigarettes. She has a 2.50 pack-year smoking history. She has never used smokeless tobacco. She reports current alcohol use. She reports that she does not use drugs.   Current Outpatient Medications:    albuterol (PROVENTIL HFA;VENTOLIN HFA) 108 (90 BASE) MCG/ACT inhaler, Inhale 2 puffs into the lungs every 6 (six) hours as needed for wheezing or shortness of breath. , Disp: , Rfl:    hydrOXYzine (ATARAX/VISTARIL) 25 MG tablet, Take 1 tablet (25 mg total) by mouth every 8 (eight) hours as  needed for anxiety. (Patient not taking: Reported on 04/24/2019), Disp: 12 tablet, Rfl: 0   loratadine (CLARITIN) 10 MG tablet, Take 10 mg by mouth daily., Disp: , Rfl:    omeprazole (PRILOSEC) 20 MG capsule, Take 20 mg by mouth 2 (two) times daily before a meal., Disp: , Rfl:    ondansetron (ZOFRAN) 4 MG tablet, Take 1 tablet (4 mg total) by mouth every 8  (eight) hours as needed for nausea or vomiting., Disp: 20 tablet, Rfl: 0   promethazine (PHENERGAN) 25 MG tablet, Take 1 tablet (25 mg total) by mouth every 6 (six) hours as needed for nausea or vomiting., Disp: 10 tablet, Rfl: 0   sertraline (ZOLOFT) 50 MG tablet, Take 1 tablet (50 mg total) by mouth daily. (Patient not taking: Reported on 04/24/2019), Disp: 30 tablet, Rfl: 1   Sucralfate (CARAFATE PO), Take by mouth. Omeprazole and sodium bicarbonate - every 6 hours, Disp: , Rfl:   EXAM:   VITALS per patient if applicable: None reported  GENERAL: alert, oriented, appears well and in no acute distress  HEENT: atraumatic, conjunttiva clear, no obvious abnormalities on inspection of external nose and ears  NECK: normal movements of the head and neck  LUNGS: on inspection no signs of respiratory distress, breathing rate appears normal, no obvious gross increased work of breathing, gasping or wheezing  CV: no obvious cyanosis  MS: moves all visible extremities without noticeable abnormality  PSYCH/NEURO: pleasant and cooperative, no obvious depression or anxiety, speech and thought processing grossly intact  ASSESSMENT AND PLAN:   Anxiety -I think Zoloft is a good choice for her. -I will schedule follow-up with her in 4 to 6 weeks to see how she is responding to medication.    I discussed the assessment and treatment plan with the patient. The patient was provided an opportunity to ask questions and all were answered. The patient agreed with the plan and demonstrated an understanding of the instructions.   The patient was advised to call back or seek an in-person evaluation if the symptoms worsen or if the condition fails to improve as anticipated.    Meagan JanEstela Hernandez Acosta, MD  [facility]

## 2019-07-06 ENCOUNTER — Emergency Department (HOSPITAL_COMMUNITY)
Admission: EM | Admit: 2019-07-06 | Discharge: 2019-07-06 | Disposition: A | Discharge status: Home / Self Care | Payer: Commercial Managed Care - PPO | Attending: Emergency Medicine | Admitting: Emergency Medicine

## 2019-07-06 ENCOUNTER — Encounter (HOSPITAL_COMMUNITY): Payer: Self-pay | Admitting: Emergency Medicine

## 2019-07-06 ENCOUNTER — Other Ambulatory Visit: Payer: Self-pay

## 2019-07-06 DIAGNOSIS — E876 Hypokalemia: Secondary | ICD-10-CM | POA: Diagnosis not present

## 2019-07-06 DIAGNOSIS — F17210 Nicotine dependence, cigarettes, uncomplicated: Secondary | ICD-10-CM | POA: Diagnosis not present

## 2019-07-06 DIAGNOSIS — J45909 Unspecified asthma, uncomplicated: Secondary | ICD-10-CM | POA: Insufficient documentation

## 2019-07-06 DIAGNOSIS — R197 Diarrhea, unspecified: Secondary | ICD-10-CM | POA: Diagnosis not present

## 2019-07-06 DIAGNOSIS — R112 Nausea with vomiting, unspecified: Secondary | ICD-10-CM | POA: Insufficient documentation

## 2019-07-06 LAB — COMPREHENSIVE METABOLIC PANEL
ALT: 31 U/L (ref 0–44)
AST: 26 U/L (ref 15–41)
Albumin: 4.7 g/dL (ref 3.5–5.0)
Alkaline Phosphatase: 61 U/L (ref 38–126)
Anion gap: 19 — ABNORMAL HIGH (ref 5–15)
BUN: 16 mg/dL (ref 6–20)
CO2: 26 mmol/L (ref 22–32)
Calcium: 9.8 mg/dL (ref 8.9–10.3)
Chloride: 89 mmol/L — ABNORMAL LOW (ref 98–111)
Creatinine, Ser: 1.02 mg/dL — ABNORMAL HIGH (ref 0.44–1.00)
GFR calc Af Amer: >60 mL/min (ref >60)
GFR calc non Af Amer: >60 mL/min (ref >60)
Glucose, Bld: 126 mg/dL — ABNORMAL HIGH (ref 70–99)
Potassium: 2.7 mmol/L — CL (ref 3.5–5.1)
Sodium: 134 mmol/L — ABNORMAL LOW (ref 135–145)
Total Bilirubin: 1.7 mg/dL — ABNORMAL HIGH (ref 0.3–1.2)
Total Protein: 9.1 g/dL — ABNORMAL HIGH (ref 6.5–8.1)

## 2019-07-06 LAB — CBC
HCT: 50.1 % — ABNORMAL HIGH (ref 36.0–46.0)
Hemoglobin: 17.5 g/dL — ABNORMAL HIGH (ref 12.0–15.0)
MCH: 34.6 pg — ABNORMAL HIGH (ref 26.0–34.0)
MCHC: 34.9 g/dL (ref 30.0–36.0)
MCV: 99 fL (ref 80.0–100.0)
Platelets: 383 10*3/uL (ref 150–400)
RBC: 5.06 MIL/uL (ref 3.87–5.11)
RDW: 14.6 % (ref 11.5–15.5)
WBC: 14.6 10*3/uL — ABNORMAL HIGH (ref 4.0–10.5)
nRBC: 0 % (ref 0.0–0.2)

## 2019-07-06 LAB — URINALYSIS, ROUTINE W REFLEX MICROSCOPIC
Glucose, UA: NEGATIVE mg/dL
Ketones, ur: 20 mg/dL — AB
Nitrite: NEGATIVE
Protein, ur: >=300 mg/dL — AB
Specific Gravity, Urine: 1.027 (ref 1.005–1.030)
pH: 6 (ref 5.0–8.0)

## 2019-07-06 LAB — MAGNESIUM: Magnesium: 2 mg/dL (ref 1.7–2.4)

## 2019-07-06 LAB — I-STAT BETA HCG BLOOD, ED (MC, WL, AP ONLY): I-stat hCG, quantitative: <5 m[IU]/mL (ref <5)

## 2019-07-06 LAB — LIPASE, BLOOD: Lipase: 22 U/L (ref 11–51)

## 2019-07-06 MED ORDER — SODIUM CHLORIDE 0.9 % IV BOLUS
2000.0000 mL | Freq: Once | INTRAVENOUS | Status: AC
  Administered 2019-07-06: 16:00:00 2000 mL via INTRAVENOUS

## 2019-07-06 MED ORDER — METOCLOPRAMIDE HCL 5 MG/ML IJ SOLN
10.0000 mg | Freq: Once | INTRAMUSCULAR | Status: AC
  Administered 2019-07-06: 10 mg via INTRAVENOUS
  Filled 2019-07-06: qty 2

## 2019-07-06 MED ORDER — LORAZEPAM 2 MG/ML IJ SOLN
0.5000 mg | Freq: Once | INTRAMUSCULAR | Status: AC
  Administered 2019-07-06: 0.5 mg via INTRAVENOUS
  Filled 2019-07-06: qty 1

## 2019-07-06 MED ORDER — METOCLOPRAMIDE HCL 10 MG PO TABS: 10.0000 mg | ORAL_TABLET | Freq: Four times a day (QID) | ORAL | 0 refills | Status: DC | PRN

## 2019-07-06 MED ORDER — LORAZEPAM 0.5 MG PO TABS: 0.5000 mg | ORAL_TABLET | Freq: Four times a day (QID) | ORAL | 0 refills | Status: DC | PRN

## 2019-07-06 MED ORDER — POTASSIUM CHLORIDE 10 MEQ/100ML IV SOLN
10.0000 meq | INTRAVENOUS | Status: AC
  Administered 2019-07-06 (×2): 10 meq via INTRAVENOUS
  Filled 2019-07-06 (×2): qty 100

## 2019-07-06 MED ORDER — POTASSIUM CHLORIDE CRYS ER 20 MEQ PO TBCR
40.0000 meq | EXTENDED_RELEASE_TABLET | Freq: Once | ORAL | Status: AC
  Administered 2019-07-06: 40 meq via ORAL
  Filled 2019-07-06: qty 2

## 2019-07-06 MED ORDER — POTASSIUM CHLORIDE ER 10 MEQ PO TBCR: 10.0000 meq | EXTENDED_RELEASE_TABLET | Freq: Every day | ORAL | 0 refills | Status: DC

## 2019-07-06 NOTE — ED Triage Notes (Signed)
Pt reports since Tuesday had vomiting. Denies diarrhea in 2 days. Reports started taking a new depression medication, Expos, and unsure if had sensitivity to it, so stopped taking.

## 2019-07-06 NOTE — ED Provider Notes (Signed)
[facility] DEPT Provider Note   CSN: [id number] Arrival date & time: 07/06/19  1258    History   Chief Complaint Chief Complaint  Patient presents with  . Emesis    HPI Meagan Mason is a 38 y.o. female.     HPI Patient presents with vomiting for the past 4 days.  She is had difficulty tolerating oral intake.  At onset of the vomiting she had mild diarrhea but this is resolved.  Symptoms started after eating macaroni and cheese and brisket.  She denies any abdominal pain.  No fever or chills.  No blood in the stool or vomit.  Has attempted to take her over-the-counter medication.  Patient states she is had 2 similar episodes in the past.  Has not seen a gastroenterologist.  Admits to increasing anxiety levels. Past Medical History:  Diagnosis Date  . Allergy   . Asthma    states only when stressed out  . History of bronchitis    in college  . Neck pain    ruptured disc in neck  . URI (upper respiratory infection)    early Nov 2014  . Weakness    numbness in left arm    Patient Active Problem List   Diagnosis Date Noted  . Anxiety 04/24/2019  . Acute upper respiratory infection 10/30/2018  . Head congestion 10/30/2018  . Sore throat (viral) 10/30/2018  . Gastritis 02/06/2016  . Tobacco abuse 02/06/2016  . Health care maintenance 02/06/2016    Past Surgical History:  Procedure Laterality Date  . WISDOM TOOTH EXTRACTION       OB History   No obstetric history on file.      Home Medications    Prior to Admission medications   Medication Sig Start Date End Date Taking? Authorizing Provider  CARAFATE 1 GM/10ML suspension Take 10 mLs by mouth 4 (four) times daily. 07/05/19  Yes [provider]  loratadine (CLARITIN) 10 MG tablet Take 10 mg by mouth daily as needed for allergies.    Yes [provider]  omeprazole (PRILOSEC) 20 MG capsule Take 20 mg by mouth daily.    Yes [provider]   ondansetron (ZOFRAN) 4 MG tablet Take 1 tablet (4 mg total) by mouth every 8 (eight) hours as needed for nausea or vomiting. Patient taking differently: Take 4 mg by mouth every 4 (four) hours as needed for nausea or vomiting.  04/19/19  Yes Lamptey, Myrene Galas, MD  promethazine (PHENERGAN) 25 MG tablet Take 1 tablet (25 mg total) by mouth every 6 (six) hours as needed for nausea or vomiting. Patient taking differently: Take 25 mg by mouth every 4 (four) hours as needed for nausea or vomiting.  04/20/19  Yes Carlisle Cater, PA-C  hydrOXYzine (ATARAX/VISTARIL) 25 MG tablet Take 1 tablet (25 mg total) by mouth every 8 (eight) hours as needed for anxiety. Patient not taking: Reported on 07/06/2019 04/19/19   Chase Picket, MD  LORazepam (ATIVAN) 0.5 MG tablet Take 1 tablet (0.5 mg total) by mouth every 6 (six) hours as needed for anxiety. 07/06/19   Julianne Rice, MD  metoCLOPramide (REGLAN) 10 MG tablet Take 1 tablet (10 mg total) by mouth every 6 (six) hours as needed for nausea or vomiting. 07/06/19   Julianne Rice, MD  potassium chloride (K-DUR) 10 MEQ tablet Take 1 tablet (10 mEq total) by mouth daily. 07/06/19   Julianne Rice, MD  sertraline (ZOLOFT) 50 MG tablet Take 1 tablet (50 mg  total) by mouth daily. Patient not taking: Reported on 04/24/2019 04/19/19   Merrilee JanskyLamptey, Philip O, MD    Family History Family History  Problem Relation Age of Onset  . Heart disease Mother   . Hypertension Mother   . Heart attack Mother [id number]  . Early death Father   . Cancer Father 6948       stomach cancer  . Stroke Father   . Heart attack Father        After stroke  . Breast cancer Paternal Grandmother [id number]    Social History Social History   Tobacco Use  . Smoking status: Current Every Day Smoker    Packs/day: 0.25    Years: 10.00    Pack years: 2.50    Types: Cigarettes  . Smokeless tobacco: Never Used  Substance Use Topics  . Alcohol use: Yes    Comment: wine 2-3 times a week  . Drug use: No      Allergies   Other and Benadryl [diphenhydramine]   Review of Systems Review of Systems  Constitutional: Negative for chills and fever.  HENT: Negative for sore throat and trouble swallowing.   Eyes: Negative for visual disturbance.  Respiratory: Negative for cough and shortness of breath.   Cardiovascular: Negative for chest pain.  Gastrointestinal: Positive for diarrhea, nausea and vomiting. Negative for abdominal pain, blood in stool and constipation.  Genitourinary: Negative for dysuria, flank pain, frequency and hematuria.  Musculoskeletal: Negative for back pain, joint swelling, myalgias and neck pain.  Skin: Negative for rash and wound.  Neurological: Positive for dizziness. Negative for weakness, light-headedness, numbness and headaches.  All other systems reviewed and are negative.    Physical Exam Updated Vital Signs BP (!) 151/99   Pulse 99   Temp 99.5 F (37.5 C) (Oral)   Resp (!) 21   LMP 06/20/2019   SpO2 97%   Physical Exam Vitals signs and nursing note reviewed.  Constitutional:      Appearance: She is well-developed.     Comments: Anxious appearing  HENT:     Head: Normocephalic and atraumatic.     Nose: Nose normal.     Mouth/Throat:     Mouth: Mucous membranes are moist.  Eyes:     Extraocular Movements: Extraocular movements intact.     Pupils: Pupils are equal, round, and reactive to light.  Neck:     Musculoskeletal: Normal range of motion and neck supple. No neck rigidity or muscular tenderness.  Cardiovascular:     Rate and Rhythm: Regular rhythm. Tachycardia present.     Heart sounds: No murmur. No friction rub. No gallop.   Pulmonary:     Effort: Pulmonary effort is normal. No respiratory distress.     Breath sounds: Normal breath sounds. No stridor. No wheezing, rhonchi or rales.  Chest:     Chest wall: No tenderness.  Abdominal:     General: Bowel sounds are normal. There is no distension.     Palpations: Abdomen is soft. There is  no mass.     Tenderness: There is no abdominal tenderness. There is no right CVA tenderness, left CVA tenderness, guarding or rebound.     Comments: Abdomen is soft and nontender.  Musculoskeletal: Normal range of motion.        General: No swelling, tenderness, deformity or signs of injury.     Right lower leg: No edema.     Left lower leg: No edema.  Lymphadenopathy:     Cervical: No  cervical adenopathy.  Skin:    General: Skin is warm and dry.     Capillary Refill: Capillary refill takes less than 2 seconds.     Findings: No erythema or rash.  Neurological:     General: No focal deficit present.     Mental Status: She is alert and oriented to person, place, and time.     Comments: Patient is alert and oriented x3 with clear, goal oriented speech. Patient has 5/5 motor in all extremities. Sensation is intact to light touch. Bilateral finger-to-nose is normal with no signs of dysmetria.   Psychiatric:        Behavior: Behavior normal.      ED Treatments / Results  Labs (all labs ordered are listed, but only abnormal results are displayed) Labs Reviewed  COMPREHENSIVE METABOLIC PANEL - Abnormal; Notable for the following components:      Result Value   Sodium 134 (*)    Potassium 2.7 (*)    Chloride 89 (*)    Glucose, Bld 126 (*)    Creatinine, Ser 1.02 (*)    Total Protein 9.1 (*)    Total Bilirubin 1.7 (*)    Anion gap 19 (*)    All other components within normal limits  CBC - Abnormal; Notable for the following components:   WBC 14.6 (*)    Hemoglobin 17.5 (*)    HCT 50.1 (*)    MCH 34.6 (*)    All other components within normal limits  URINALYSIS, ROUTINE W REFLEX MICROSCOPIC - Abnormal; Notable for the following components:   Color, Urine AMBER (*)    APPearance CLOUDY (*)    Hgb urine dipstick SMALL (*)    Bilirubin Urine SMALL (*)    Ketones, ur 20 (*)    Protein, ur >=300 (*)    Leukocytes,Ua SMALL (*)    Bacteria, UA MANY (*)    All other components  within normal limits  URINE CULTURE  LIPASE, BLOOD  MAGNESIUM  I-STAT BETA HCG BLOOD, ED ([facility], [facility], AP ONLY)    EKG None  Radiology No results found.  Procedures Procedures (including critical care time)  Medications Ordered in ED Medications  potassium chloride SA (K-DUR) CR tablet 40 mEq (has no administration in time range)  sodium chloride 0.9 % bolus 2,000 mL (0 mLs Intravenous Stopped 07/06/19 1903)  metoCLOPramide (REGLAN) injection 10 mg (10 mg Intravenous Given 07/06/19 1624)  LORazepam (ATIVAN) injection 0.5 mg (0.5 mg Intravenous Given 07/06/19 1623)  potassium chloride 10 mEq in 100 mL IVPB (0 mEq Intravenous Stopped 07/06/19 1903)     Initial Impression / Assessment and Plan / ED Course  I have reviewed the triage vital signs and the nursing notes.  Pertinent labs & imaging results that were available during my care of the patient were reviewed by me and considered in my medical decision making (see chart for details).        Patient states she is feeling much better after medication, potassium replacement and IV fluids.  Advised to avoid rich and fatty foods.  Advised to follow-up with her primary physician to have her potassium remeasured.  I have also given patient referral to gastroenterology.  Return precautions given. Final Clinical Impressions(s) / ED Diagnoses   Final diagnoses:  Nausea vomiting and diarrhea  Hypokalemia    ED Discharge Orders         Ordered    metoCLOPramide (REGLAN) 10 MG tablet  Every 6 hours PRN  07/06/19 2041    LORazepam (ATIVAN) 0.5 MG tablet  Every 6 hours PRN     07/06/19 2041    potassium chloride (K-DUR) 10 MEQ tablet  Daily     07/06/19 2041           Loren RacerYelverton, Jontavius Rabalais, MD 07/06/19 2043

## 2019-07-06 NOTE — ED Notes (Signed)
Date and time results received: 07/06/19 2:18 PM  (use smartphrase ".now" to insert current time)  Test: K+ Critical Value: 2.7  Name of Provider Notified: Notified triage RN that pt needs to come to a room  Orders Received? Or Actions Taken?: Actions Taken: Notified Triage RN

## 2019-07-06 NOTE — ED Notes (Signed)
Pt allowed this RN to hook her up to the monitor but refused an EKG.

## 2019-07-08 LAB — URINE CULTURE

## 2019-07-10 ENCOUNTER — Telehealth (INDEPENDENT_AMBULATORY_CARE_PROVIDER_SITE_OTHER): Payer: Commercial Managed Care - PPO | Admitting: Family Medicine

## 2019-07-10 ENCOUNTER — Encounter: Payer: Self-pay | Admitting: Family Medicine

## 2019-07-10 ENCOUNTER — Other Ambulatory Visit: Payer: Self-pay

## 2019-07-10 DIAGNOSIS — R112 Nausea with vomiting, unspecified: Secondary | ICD-10-CM | POA: Diagnosis not present

## 2019-07-10 DIAGNOSIS — E876 Hypokalemia: Secondary | ICD-10-CM

## 2019-07-10 NOTE — Progress Notes (Signed)
This visit type was conducted due to national recommendations for restrictions regarding the COVID-19 pandemic in an effort to limit this patient's exposure and mitigate transmission in our community.   Virtual Visit via Video Note  I connected with Jeslyn Amsler on 07/10/19 at 10:30 AM EDT by a video enabled telemedicine application and verified that I am speaking with the correct person using two identifiers.  Location patient: home Location provider:work or home office Persons participating in the virtual visit: patient, provider  I discussed the limitations of evaluation and management by telemedicine and the availability of in person appointments. The patient expressed understanding and agreed to proceed.   HPI: Patient has had recurrent episodes of nausea and vomiting most recently last week.  She states onset around Tuesday of nausea and vomiting and mild diarrhea.  She went to the ER on Friday.  She had lab work done with potassium 2.7.  White blood count 14.6.  Urinalysis revealed multiple bacteria but urine culture was negative.  Kaylon thinks a lot of this is stress related.  She is seeing psychiatrist.  She was placed recent Lexapro and she thinks that may have triggered her nausea.  They have discussed switching her to Wellbutrin.  She actually has pending follow-up with [facility] soon.  She denies any recent weight loss, fever, abdominal pain, or any change in bowel habits other than some recent mild diarrhea which is resolved now.  She is taken multiple antinausea medications including Zofran and Phenergan.  She was recently prescribed Reglan.  She is keeping down fluids and basically feels back to baseline at this time.  She had CT abdomen pelvis back in May which showed no acute abnormalities.  No gallstones.  Patient states she has very low threshold for nausea.  She frequently gets nausea even with things like brushing teeth.  Also water consumption can make her nauseous   ROS:  See pertinent positives and negatives per HPI.  Past Medical History:  Diagnosis Date  . Allergy   . Asthma    states only when stressed out  . History of bronchitis    in college  . Neck pain    ruptured disc in neck  . URI (upper respiratory infection)    early Nov 2014  . Weakness    numbness in left arm    Past Surgical History:  Procedure Laterality Date  . WISDOM TOOTH EXTRACTION      Family History  Problem Relation Age of Onset  . Heart disease Mother   . Hypertension Mother   . Heart attack Mother 44  . Early death Father   . Cancer Father 44       stomach cancer  . Stroke Father   . Heart attack Father        After stroke  . Breast cancer Paternal Grandmother 4    SOCIAL HX: smoker   Current Outpatient Medications:  .  CARAFATE 1 GM/10ML suspension, Take 10 mLs by mouth 4 (four) times daily., Disp: , Rfl:  .  hydrOXYzine (ATARAX/VISTARIL) 25 MG tablet, Take 1 tablet (25 mg total) by mouth every 8 (eight) hours as needed for anxiety. (Patient not taking: Reported on 07/06/2019), Disp: 12 tablet, Rfl: 0 .  loratadine (CLARITIN) 10 MG tablet, Take 10 mg by mouth daily as needed for allergies. , Disp: , Rfl:  .  LORazepam (ATIVAN) 0.5 MG tablet, Take 1 tablet (0.5 mg total) by mouth every 6 (six) hours as needed for anxiety., Disp:  10 tablet, Rfl: 0 .  metoCLOPramide (REGLAN) 10 MG tablet, Take 1 tablet (10 mg total) by mouth every 6 (six) hours as needed for nausea or vomiting., Disp: 30 tablet, Rfl: 0 .  omeprazole (PRILOSEC) 20 MG capsule, Take 20 mg by mouth daily. , Disp: , Rfl:  .  ondansetron (ZOFRAN) 4 MG tablet, Take 1 tablet (4 mg total) by mouth every 8 (eight) hours as needed for nausea or vomiting. (Patient taking differently: Take 4 mg by mouth every 4 (four) hours as needed for nausea or vomiting. ), Disp: 20 tablet, Rfl: 0 .  potassium chloride (K-DUR) 10 MEQ tablet, Take 1 tablet (10 mEq total) by mouth daily., Disp: 5 tablet, Rfl: 0 .   promethazine (PHENERGAN) 25 MG tablet, Take 1 tablet (25 mg total) by mouth every 6 (six) hours as needed for nausea or vomiting. (Patient taking differently: Take 25 mg by mouth every 4 (four) hours as needed for nausea or vomiting. ), Disp: 10 tablet, Rfl: 0 .  sertraline (ZOLOFT) 50 MG tablet, Take 1 tablet (50 mg total) by mouth daily. (Patient not taking: Reported on 04/24/2019), Disp: 30 tablet, Rfl: 1  EXAM:  VITALS per patient if applicable:  GENERAL: alert, oriented, appears well and in no acute distress  HEENT: atraumatic, conjunttiva clear, no obvious abnormalities on inspection of external nose and ears  NECK: normal movements of the head and neck  LUNGS: on inspection no signs of respiratory distress, breathing rate appears normal, no obvious gross SOB, gasping or wheezing  CV: no obvious cyanosis  MS: moves all visible extremities without noticeable abnormality  PSYCH/NEURO: pleasant and cooperative, no obvious depression or anxiety, speech and thought processing grossly intact  ASSESSMENT AND PLAN:  Discussed the following assessment and plan:  Recurrent episodes of nausea and vomiting.  Question stress related.  This has been a chronic intermittent problem for her.  Possible recent trigger with Lexapro  -Stressed importance of adequate potassium in diet. -Keep follow-up with GI as scheduled -She will taper off regular use of Reglan and use only as needed if nausea not relieved with Zofran    I discussed the assessment and treatment plan with the patient. The patient was provided an opportunity to ask questions and all were answered. The patient agreed with the plan and demonstrated an understanding of the instructions.   The patient was advised to call back or seek an in-person evaluation if the symptoms worsen or if the condition fails to improve as anticipated.   Evelena PeatBruce Burchette, MD

## 2019-07-24 ENCOUNTER — Other Ambulatory Visit: Payer: Self-pay | Admitting: Physician Assistant

## 2019-07-24 DIAGNOSIS — R112 Nausea with vomiting, unspecified: Secondary | ICD-10-CM

## 2019-07-27 ENCOUNTER — Telehealth: Payer: Commercial Managed Care - PPO | Admitting: Nurse Practitioner

## 2019-07-27 DIAGNOSIS — H938X3 Other specified disorders of ear, bilateral: Secondary | ICD-10-CM

## 2019-07-27 NOTE — Progress Notes (Signed)
Based on what you shared with me it looks like you have ears clogged,that should be evaluated in a face to face office visit. This does not sound like swimmers ear. Could just be impacted wax that needs to be removed. This is not urgent if you want to wait and make appointment with PCP. Or you can go to urgent care.   NOTE: If you entered your credit card information for this eVisit, you will not be charged. You may see a "hold" on your card for the $30 but that hold will drop off and you will not have a charge processed.  If you are having a true medical emergency please call 911.  If you need an urgent face to face visit, [facility] has four urgent care centers for your convenience.  If you need care fast and have a high deductible or no insurance consider: ?  [email or web address] to reserve your spot online an avoid wait times  [facility] [address], [address] [place], [place] [address] 8 am to 8 pm Monday-Friday 10 am to 4 pm Saturday-Sunday *Across the street from [employer]  [place], [address] 8 am to 5 pm Monday-Friday * In the [facility] on the [facility]   The following sites will take your insurance:  [facility] [place] a Provider at this Location  [place], [place] [address] 10 am to 8 pm Monday-Friday 12 pm to 8 pm [facility] [place] a Provider at this Location  [place], [place], [place] [address] 8 am to 8 pm Monday-Friday 9 am to 6 pm Saturday 11 am to 6 pm Sunday   [facility]  [phone] Get Driving Directions  [address] [place], [place] [address] 8 am to 8 pm Monday-Friday 8 am to 4 pm Saturday-Sunday   Your e-visit answers  were reviewed by a board certified advanced clinical practitioner to complete your personal care plan

## 2019-08-04 ENCOUNTER — Telehealth: Payer: Commercial Managed Care - PPO | Admitting: Family

## 2019-08-04 DIAGNOSIS — L309 Dermatitis, unspecified: Secondary | ICD-10-CM

## 2019-08-04 NOTE — Progress Notes (Signed)
Based on what you shared with me, I feel your condition warrants further evaluation and I recommend that you be seen for a face to face office visit.  It is likely not serious but I would like to see it in person.   NOTE: If you entered your credit card information for this eVisit, you will not be charged. You may see a "hold" on your card for the $35 but that hold will drop off and you will not have a charge processed.  If you are having a true medical emergency please call 911.     For an urgent face to face visit, [facility] has five urgent care centers for your convenience:   [email or web address] to reserve your spot online an avoid wait times  [place], [place], [place] [address] *Just off [address], across the road from [place] hours of operation: Monday-Friday, 12 PM to 6 PM  Closed Saturday & Sunday    . [facility]    [phone]                  Get Driving Directions  [address], [place] [address] . 10 am to 8 pm Monday-Friday . 12 pm to 8 pm Saturday-Sunday   . [facility] at [place]                  Get Driving Directions  [address], [place], [place] [address] . 8 am to 8 pm Monday-Friday . 9 am to 6 pm Saturday . 11 am to 6 pm Sunday   . [facility] at [place]                  Get Driving Directions   [address] [place], [place] [address] . 8 am to 8 pm Monday-Friday . 8 am to 4 pm Saturday-Sunday    . [facility] at [place]                    Get Driving Directions  [phone]  [address], [place], [place] [address]  . Monday-Friday, 12 PM to 6 PM    Your e-visit answers were reviewed by a board certified advanced clinical practitioner to complete your personal care plan.  Thank you for using  e-Visits.

## 2019-08-28 ENCOUNTER — Other Ambulatory Visit: Payer: Commercial Managed Care - PPO

## 2019-10-22 ENCOUNTER — Telehealth: Payer: Commercial Managed Care - PPO | Admitting: Family

## 2019-10-22 ENCOUNTER — Ambulatory Visit
Admission: EM | Admit: 2019-10-22 | Discharge: 2019-10-22 | Disposition: A | Discharge status: Home / Self Care | Payer: Commercial Managed Care - PPO

## 2019-10-22 ENCOUNTER — Other Ambulatory Visit: Payer: Self-pay

## 2019-10-22 DIAGNOSIS — L299 Pruritus, unspecified: Secondary | ICD-10-CM

## 2019-10-22 DIAGNOSIS — R21 Rash and other nonspecific skin eruption: Secondary | ICD-10-CM

## 2019-10-22 MED ORDER — PREDNISONE 20 MG PO TABS: 20.0000 mg | ORAL_TABLET | Freq: Two times a day (BID) | ORAL | 0 refills | Status: AC

## 2019-10-22 MED ORDER — HYDROXYZINE HCL 25 MG PO TABS: 25.0000 mg | ORAL_TABLET | Freq: Every evening | ORAL | 0 refills | Status: DC | PRN

## 2019-10-22 MED ORDER — METHYLPREDNISOLONE SODIUM SUCC 125 MG IJ SOLR
125.0000 mg | Freq: Once | INTRAMUSCULAR | Status: AC
  Administered 2019-10-22: 125 mg via INTRAMUSCULAR

## 2019-10-22 NOTE — Discharge Instructions (Signed)
Steroid shot given in office Prednisone prescribed.  Take as directed and to completion Hydroxyzine prescribed.  Take at bedtime for nighttime relief.  Avoid driving or operating heavy machinery, as this medication may make you drowsy Follow up with PCP if symptoms persists Return or go to the ER if you have any new or worsening symptoms such as fever, chills, nausea, vomiting, redness, swelling, discharge, difficulty breathing, drooling, chest pain, shortness of breath, difficulty swallowing, if symptoms do not improve with medications, etc..Marland Kitchen

## 2019-10-22 NOTE — Progress Notes (Signed)
Based on what you shared with me, I feel your condition warrants further evaluation and I recommend that you be seen for a face to face office visit.   Given your current symptoms, I feel that it would be best if you were seen face to face to rule out any type of allergic reaction. 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    NOTE: If you entered your credit card information for this eVisit, you will not be charged. You may see a "hold" on your card for the $35 but that hold will drop off and you will not have a charge processed.   If you are having a true medical emergency please call 911.      For an urgent face to face visit, [facility] has five urgent care centers for your convenience:      NEW:  [facility] [place] at [place] Get Driving Directions [phone] [place], [place] [address] . 10 am - 6pm Monday - Friday    [facility]) Get Driving Directions [phone] [address] [place], [place] [address] . 10 am to 8 pm Monday-Friday . 12 pm to 8 pm [facility] at [facility] Get Driving Directions [phone] [place], [place], [place] [address] . 8 am to 8 pm Monday-Friday . 9 am to 6 pm Saturday . 11 am to 6 pm Sunday     [facility] at [facility] Get Driving Directions  [phone] [address] [place], [place] [address] . 8 am to 8 pm Monday-Friday . 8 am to 4 pm [facility]gent Care at [place] Get Driving Directions [phone] [place]., [place],  [address] . 12 pm to 6 pm Monday-Friday      Your e-visit answers were reviewed by a board certified advanced clinical practitioner to complete your personal care plan.  Thank you for using e-Visits.

## 2019-10-22 NOTE — ED Provider Notes (Signed)
[place]   [id number] 10/22/19 Arrival Time: 1909  CC: Rash  SUBJECTIVE:  Meagan Mason is a 38 y.o. female who presents with a rash that began 3 days ago.  Gathering firewood prior to symptoms.  Rash diffuse about the body, originated on the forearms, and has spread to multiple areas of the body.  Describes it as itching, red, and raised.  Has tried claritin without relief.  Symptoms are made worse with itching.  Reports similar symptoms in the past related to poison ivy.   Denies fever, chills, nausea, vomiting, swelling, discharge, dysphagia, drooling, dyspnea, SOB, chest pain, abdominal pain, changes in bowel or bladder function.    ROS: As per HPI.  All other pertinent ROS negative.     Past Medical History:  Diagnosis Date  . Allergy   . Asthma    states only when stressed out  . History of bronchitis    in college  . Neck pain    ruptured disc in neck  . URI (upper respiratory infection)    early Nov 2014  . Weakness    numbness in left arm   Past Surgical History:  Procedure Laterality Date  . WISDOM TOOTH EXTRACTION     Allergies  Allergen Reactions  . Other Shortness Of Breath and Itching    Pecans, tree nuts   . Benadryl [Diphenhydramine] Other (See Comments)    hyperactivity   No current facility-administered medications on file prior to encounter.    Current Outpatient Medications on File Prior to Encounter  Medication Sig Dispense Refill  . escitalopram (LEXAPRO) 20 MG tablet TAKE 1 TABLET BY MOUTH EVERY DAY (START IN 1 MONTH)    . loratadine (CLARITIN) 10 MG tablet Take 10 mg by mouth daily as needed for allergies.     Marland Kitchen ondansetron (ZOFRAN) 4 MG tablet Take 1 tablet (4 mg total) by mouth every 8 (eight) hours as needed for nausea or vomiting. (Patient taking differently: Take 4 mg by mouth every 4 (four) hours as needed for nausea or vomiting. ) 20 tablet 0  . potassium chloride (K-DUR) 10 MEQ tablet Take 1 tablet (10 mEq total) by  mouth daily. 5 tablet 0  . promethazine (PHENERGAN) 25 MG tablet Take 1 tablet (25 mg total) by mouth every 6 (six) hours as needed for nausea or vomiting. (Patient taking differently: Take 25 mg by mouth every 4 (four) hours as needed for nausea or vomiting. ) 10 tablet 0  . sertraline (ZOLOFT) 50 MG tablet Take 1 tablet (50 mg total) by mouth daily. (Patient not taking: Reported on 04/24/2019) 30 tablet 1  . [DISCONTINUED] CARAFATE 1 GM/10ML suspension Take 10 mLs by mouth 4 (four) times daily.    . [DISCONTINUED] metoCLOPramide (REGLAN) 10 MG tablet Take 1 tablet (10 mg total) by mouth every 6 (six) hours as needed for nausea or vomiting. 30 tablet 0  . [DISCONTINUED] omeprazole (PRILOSEC) 20 MG capsule Take 20 mg by mouth daily.      Social History   Socioeconomic History  . Marital status: Single    Spouse name: Not on file  . Number of children: Not on file  . Years of education: Not on file  . Highest education level: Not on file  Occupational History  . Not on file  Social Needs  . Financial resource strain: Not on file  . Food insecurity    Worry: Not on file    Inability: Not on file  . Transportation needs  Medical: Not on file    Non-medical: Not on file  Tobacco Use  . Smoking status: Current Every Day Smoker    Packs/day: 0.25    Years: 10.00    Pack years: 2.50    Types: Cigarettes  . Smokeless tobacco: Never Used  Substance and Sexual Activity  . Alcohol use: Yes    Comment: wine 2-3 times a week  . Drug use: No  . Sexual activity: Yes  Lifestyle  . Physical activity    Days per week: Not on file    Minutes per session: Not on file  . Stress: Not on file  Relationships  . Social Musician on phone: Not on file    Gets together: Not on file    Attends religious service: Not on file    Active member of club or organization: Not on file    Attends meetings of clubs or organizations: Not on file    Relationship status: Not on file  . Intimate  partner violence    Fear of current or ex partner: Not on file    Emotionally abused: Not on file    Physically abused: Not on file    Forced sexual activity: Not on file  Other Topics Concern  . Not on file  Social History Narrative   Engaged.   College degree, bachelors. Works in Lexicographer.   Occasional alcohol. Every day smoker. No drugs.   Drink caffeinated beverages.   Wears her seatbelt and bicycle helmet.   Smoke detector at home.   Family History  Problem Relation Age of Onset  . Heart disease Mother   . Hypertension Mother   . Heart attack Mother 54  . Early death Father   . Cancer Father 67       stomach cancer  . Stroke Father   . Heart attack Father        After stroke  . Breast cancer Paternal Grandmother 23    OBJECTIVE: Vitals:   10/22/19 1922  BP: 137/82  Pulse: 80  Resp: 18  Temp: 98.8 F (37.1 C)  SpO2: 96%    General appearance: alert; no distress Head: NCAT ENT: EOMI grossly; EACs clear Lungs: Normal respiratory effort Extremities: no edema Skin: warm and dry; slightly raised erythematous maculopapular rash in linear distributions and excoriations diffuse about the body including bilateral anterior upper arms, RT side of neck, abdomen, and posterior RT thigh; NTTP, blanches with pressure, no obvious signs of drainage or bleeding Psychological: alert and cooperative; normal mood and affect  ASSESSMENT & PLAN:  1. Rash and nonspecific skin eruption   2. Itching     Meds ordered this encounter  Medications  . methylPREDNISolone sodium succinate (SOLU-MEDROL) 125 mg/2 mL injection 125 mg  . predniSONE (DELTASONE) 20 MG tablet    Sig: Take 1 tablet (20 mg total) by mouth 2 (two) times daily with a meal for 5 days.    Dispense:  10 tablet    Refill:  0    Order Specific Question:   Supervising Provider    Answer:   Eustace Moore [[id number]]  . hydrOXYzine (ATARAX/VISTARIL) 25 MG tablet    Sig: Take 1 tablet (25 mg total)  by mouth at bedtime as needed.    Dispense:  12 tablet    Refill:  0    Order Specific Question:   Supervising Provider    Answer:   Eustace Moore [[id number]]   Steroid shot  given in office Prednisone prescribed.  Take as directed and to completion Hydroxyzine prescribed.  Take at bedtime for nighttime relief.  Avoid driving or operating heavy machinery, as this medication may make you drowsy Follow up with PCP if symptoms persists Return or go to the ER if you have any new or worsening symptoms such as fever, chills, nausea, vomiting, redness, swelling, discharge, difficulty breathing, drooling, chest pain, shortness of breath, difficulty swallowing, if symptoms do not improve with medications, etc...  Reviewed expectations re: course of current medical issues. Questions answered. Outlined signs and symptoms indicating need for more acute intervention. Patient verbalized understanding. After Visit Summary given.   Rennis HardingWurst, Hurschel Paynter, PA-C 10/22/19 1944

## 2019-10-22 NOTE — ED Triage Notes (Signed)
Pt presents with rash that she has had for past 3 days . Pt was gathering firewood when rashes appeared. Some places will resolve and will reappear in other areas

## 2019-11-08 ENCOUNTER — Encounter (HOSPITAL_COMMUNITY): Payer: Self-pay

## 2019-11-08 ENCOUNTER — Other Ambulatory Visit: Payer: Self-pay

## 2019-11-08 ENCOUNTER — Emergency Department (HOSPITAL_COMMUNITY)
Admission: EM | Admit: 2019-11-08 | Discharge: 2019-11-08 | Disposition: A | Discharge status: Home / Self Care | Payer: Commercial Managed Care - PPO | Attending: Emergency Medicine | Admitting: Emergency Medicine

## 2019-11-08 ENCOUNTER — Emergency Department (HOSPITAL_COMMUNITY): Payer: Commercial Managed Care - PPO

## 2019-11-08 DIAGNOSIS — R110 Nausea: Secondary | ICD-10-CM | POA: Diagnosis present

## 2019-11-08 DIAGNOSIS — J45909 Unspecified asthma, uncomplicated: Secondary | ICD-10-CM | POA: Diagnosis not present

## 2019-11-08 DIAGNOSIS — F17210 Nicotine dependence, cigarettes, uncomplicated: Secondary | ICD-10-CM | POA: Diagnosis not present

## 2019-11-08 DIAGNOSIS — R112 Nausea with vomiting, unspecified: Secondary | ICD-10-CM | POA: Diagnosis not present

## 2019-11-08 DIAGNOSIS — Z79899 Other long term (current) drug therapy: Secondary | ICD-10-CM | POA: Diagnosis not present

## 2019-11-08 LAB — CBC WITH DIFFERENTIAL/PLATELET
Abs Immature Granulocytes: 0.08 10*3/uL — ABNORMAL HIGH (ref 0.00–0.07)
Basophils Absolute: 0 10*3/uL (ref 0.0–0.1)
Basophils Relative: 0 %
Eosinophils Absolute: 0 10*3/uL (ref 0.0–0.5)
Eosinophils Relative: 0 %
HCT: 47.2 % — ABNORMAL HIGH (ref 36.0–46.0)
Hemoglobin: 16.1 g/dL — ABNORMAL HIGH (ref 12.0–15.0)
Immature Granulocytes: 1 %
Lymphocytes Relative: 5 %
Lymphs Abs: 0.9 10*3/uL (ref 0.7–4.0)
MCH: 33.9 pg (ref 26.0–34.0)
MCHC: 34.1 g/dL (ref 30.0–36.0)
MCV: 99.4 fL (ref 80.0–100.0)
Monocytes Absolute: 1 10*3/uL (ref 0.1–1.0)
Monocytes Relative: 6 %
Neutro Abs: 15.1 10*3/uL — ABNORMAL HIGH (ref 1.7–7.7)
Neutrophils Relative %: 88 %
Platelets: 354 10*3/uL (ref 150–400)
RBC: 4.75 MIL/uL (ref 3.87–5.11)
RDW: 12.8 % (ref 11.5–15.5)
WBC: 17.2 10*3/uL — ABNORMAL HIGH (ref 4.0–10.5)
nRBC: 0 % (ref 0.0–0.2)

## 2019-11-08 LAB — COMPREHENSIVE METABOLIC PANEL
ALT: 30 U/L (ref 0–44)
AST: 19 U/L (ref 15–41)
Albumin: 4.5 g/dL (ref 3.5–5.0)
Alkaline Phosphatase: 53 U/L (ref 38–126)
Anion gap: 18 — ABNORMAL HIGH (ref 5–15)
BUN: 20 mg/dL (ref 6–20)
CO2: 22 mmol/L (ref 22–32)
Calcium: 10 mg/dL (ref 8.9–10.3)
Chloride: 96 mmol/L — ABNORMAL LOW (ref 98–111)
Creatinine, Ser: 0.7 mg/dL (ref 0.44–1.00)
GFR calc Af Amer: >60 mL/min (ref >60)
GFR calc non Af Amer: >60 mL/min (ref >60)
Glucose, Bld: 136 mg/dL — ABNORMAL HIGH (ref 70–99)
Potassium: 3.3 mmol/L — ABNORMAL LOW (ref 3.5–5.1)
Sodium: 136 mmol/L (ref 135–145)
Total Bilirubin: 1.4 mg/dL — ABNORMAL HIGH (ref 0.3–1.2)
Total Protein: 8 g/dL (ref 6.5–8.1)

## 2019-11-08 LAB — LIPASE, BLOOD: Lipase: 19 U/L (ref 11–51)

## 2019-11-08 MED ORDER — LORAZEPAM 1 MG PO TABS: 1.0000 mg | ORAL_TABLET | Freq: Two times a day (BID) | ORAL | 0 refills | Status: DC | PRN

## 2019-11-08 MED ORDER — ONDANSETRON HCL 4 MG/2ML IJ SOLN
4.0000 mg | Freq: Once | INTRAMUSCULAR | Status: AC
  Administered 2019-11-08: 4 mg via INTRAVENOUS
  Filled 2019-11-08: qty 2

## 2019-11-08 MED ORDER — LORAZEPAM 2 MG/ML IJ SOLN
1.0000 mg | Freq: Once | INTRAMUSCULAR | Status: AC
  Administered 2019-11-08: 1 mg via INTRAVENOUS
  Filled 2019-11-08: qty 1

## 2019-11-08 MED ORDER — PROMETHAZINE HCL 25 MG/ML IJ SOLN
12.5000 mg | Freq: Once | INTRAMUSCULAR | Status: AC
  Administered 2019-11-08: 12.5 mg via INTRAVENOUS
  Filled 2019-11-08: qty 1

## 2019-11-08 MED ORDER — SODIUM CHLORIDE 0.9 % IV BOLUS
1000.0000 mL | Freq: Once | INTRAVENOUS | Status: AC
  Administered 2019-11-08: 1000 mL via INTRAVENOUS

## 2019-11-08 MED ORDER — SUCRALFATE 1 GM/10ML PO SUSP
1.0000 g | Freq: Three times a day (TID) | ORAL | Status: DC
  Administered 2019-11-08: 1 g via ORAL
  Filled 2019-11-08: qty 10

## 2019-11-08 MED ORDER — FAMOTIDINE IN NACL 20-0.9 MG/50ML-% IV SOLN
20.0000 mg | Freq: Once | INTRAVENOUS | Status: AC
  Administered 2019-11-08: 20 mg via INTRAVENOUS
  Filled 2019-11-08: qty 50

## 2019-11-08 NOTE — Discharge Instructions (Addendum)
Test showed no life-threatening condition.  Clear liquids.  Prescription for Ativan.  Consider follow-up with gastroenterologist.  Phone number given.

## 2019-11-08 NOTE — ED Provider Notes (Addendum)
[facility] Provider Note   CSN: [id number] Arrival date & time: 11/08/19  [id number]     History   Chief Complaint Chief Complaint  Patient presents with   Nausea    HPI Meagan Pattyshley Economou is a 38 y.o. female.     Multiple episodes of nausea and vomiting in the past 2 to 3 days.  This is the fourth episode of same in the past year.  She thinks anxiety may bring it on.  She has tried Zofran and Phenergan with minimal success.  Reflux symptoms sometimes lead to an asthma attack.  Severity is moderate.  Nothing makes symptoms better or worse.     Past Medical History:  Diagnosis Date   Allergy    Asthma    states only when stressed out   History of bronchitis    in college   Neck pain    ruptured disc in neck   URI (upper respiratory infection)    early Nov 2014   Weakness    numbness in left arm    Patient Active Problem List   Diagnosis Date Noted   Anxiety 04/24/2019   Acute upper respiratory infection 10/30/2018   Head congestion 10/30/2018   Sore throat (viral) 10/30/2018   Gastritis 02/06/2016   Tobacco abuse 02/06/2016   Health care maintenance 02/06/2016    Past Surgical History:  Procedure Laterality Date   WISDOM TOOTH EXTRACTION       OB History   No obstetric history on file.      Home Medications    Prior to Admission medications   Medication Sig Start Date End Date Taking? Authorizing Provider  FLUoxetine (PROZAC) 20 MG tablet Take 20 mg by mouth daily.   Yes [provider]  loratadine (CLARITIN) 10 MG tablet Take 10 mg by mouth daily as needed for allergies.    Yes [provider]  naproxen sodium (ALEVE) 220 MG tablet Take 220 mg by mouth daily as needed.   Yes [provider]  ondansetron (ZOFRAN) 4 MG tablet Take 1 tablet (4 mg total) by mouth every 8 (eight) hours as needed for nausea or vomiting. Patient taking differently: Take 4 mg by mouth every 4 (four) hours as needed for  nausea or vomiting.  04/19/19  Yes Lamptey, Britta MccreedyPhilip O, MD  promethazine (PHENERGAN) 25 MG tablet Take 1 tablet (25 mg total) by mouth every 6 (six) hours as needed for nausea or vomiting. 04/20/19  Yes Renne CriglerGeiple, Joshua, PA-C  hydrOXYzine (ATARAX/VISTARIL) 25 MG tablet Take 1 tablet (25 mg total) by mouth at bedtime as needed. Patient not taking: Reported on 11/08/2019 10/22/19   Wurst, GrenadaBrittany, PA-C  LORazepam (ATIVAN) 1 MG tablet Take 1 tablet (1 mg total) by mouth 2 (two) times daily as needed for anxiety. 11/08/19   Donnetta Hutchingook, Kalese Ensz, MD  potassium chloride (K-DUR) 10 MEQ tablet Take 1 tablet (10 mEq total) by mouth daily. Patient not taking: Reported on 11/08/2019 07/06/19   Loren RacerYelverton, David, MD  sertraline (ZOLOFT) 50 MG tablet Take 1 tablet (50 mg total) by mouth daily. Patient not taking: Reported on 04/24/2019 04/19/19   Merrilee JanskyLamptey, Philip O, MD  CARAFATE 1 GM/10ML suspension Take 10 mLs by mouth 4 (four) times daily. 07/05/19 10/22/19  [provider]  metoCLOPramide (REGLAN) 10 MG tablet Take 1 tablet (10 mg total) by mouth every 6 (six) hours as needed for nausea or vomiting. 07/06/19 10/22/19  Loren RacerYelverton, David, MD  omeprazole (PRILOSEC) 20 MG capsule Take 20  mg by mouth daily.   10/22/19  [provider]    Family History Family History  Problem Relation Age of Onset   Heart disease Mother    Hypertension Mother    Heart attack Mother 75   Early death Father    Cancer Father 57       stomach cancer   Stroke Father    Heart attack Father        After stroke   Breast cancer Paternal Grandmother 23    Social History Social History   Tobacco Use   Smoking status: Current Every Day Smoker    Packs/day: 1.00    Years: 10.00    Pack years: 10.00    Types: Cigarettes   Smokeless tobacco: Never Used  Substance Use Topics   Alcohol use: Yes    Comment: wine 2-3 times a week   Drug use: Yes    Types: Marijuana     Allergies   Other and Benadryl  [diphenhydramine]   Review of Systems Review of Systems  All other systems reviewed and are negative.    Physical Exam Updated Vital Signs BP (!) 134/91    Pulse 71    Temp 98.3 F (36.8 C) (Oral)    Resp 15    Ht 5\' 4"  (1.626 m)    Wt 63.5 kg    LMP 10/28/2019    SpO2 98%    BMI 24.03 kg/m   Physical Exam Vitals signs and nursing note reviewed.  Constitutional:      Appearance: She is well-developed.     Comments: nad  HENT:     Head: Normocephalic and atraumatic.  Eyes:     Conjunctiva/sclera: Conjunctivae normal.  Neck:     Musculoskeletal: Neck supple.  Cardiovascular:     Rate and Rhythm: Normal rate and regular rhythm.  Pulmonary:     Effort: Pulmonary effort is normal.     Breath sounds: Normal breath sounds.  Abdominal:     General: Bowel sounds are normal.     Palpations: Abdomen is soft.     Comments: Minimal abdominal tenderness.  Musculoskeletal: Normal range of motion.  Skin:    General: Skin is warm and dry.  Neurological:     General: No focal deficit present.     Mental Status: She is alert and oriented to person, place, and time.  Psychiatric:        Behavior: Behavior normal.      ED Treatments / Results  Labs (all labs ordered are listed, but only abnormal results are displayed) Labs Reviewed  COMPREHENSIVE METABOLIC PANEL - Abnormal; Notable for the following components:      Result Value   Potassium 3.3 (*)    Chloride 96 (*)    Glucose, Bld 136 (*)    Total Bilirubin 1.4 (*)    Anion gap 18 (*)    All other components within normal limits  CBC WITH DIFFERENTIAL/PLATELET - Abnormal; Notable for the following components:   WBC 17.2 (*)    Hemoglobin 16.1 (*)    HCT 47.2 (*)    Neutro Abs 15.1 (*)    Abs Immature Granulocytes 0.08 (*)    All other components within normal limits  LIPASE, BLOOD    EKG None  Radiology US Abdomen Limited  Result Date: 11/08/2019 CLINICAL DATA:  Nausea vomiting. EXAM: ULTRASOUND ABDOMEN LIMITED  RIGHT UPPER QUADRANT COMPARISON:  04/20/2019. FINDINGS: Gallbladder: No gallstones or wall thickening visualized. No sonographic Murphy sign  noted by sonographer. Common bile duct: Diameter: 3.6 mm Liver: No focal lesion identified. Within normal limits in parenchymal echogenicity. Portal vein is patent on color Doppler imaging with normal direction of blood flow towards the liver. Other: None. IMPRESSION: Normal exam.  No gallstones or biliary distention. Electronically Signed   By: Maisie Fus  Register   On: 11/08/2019 10:50    Procedures Procedures (including critical care time)  Medications Ordered in ED Medications  sodium chloride 0.9 % bolus 1,000 mL (0 mLs Intravenous Stopped 11/08/19 1131)  ondansetron (ZOFRAN) injection 4 mg (4 mg Intravenous Given 11/08/19 0844)  famotidine (PEPCID) IVPB 20 mg premix (0 mg Intravenous Stopped 11/08/19 1006)  sodium chloride 0.9 % bolus 1,000 mL (0 mLs Intravenous Stopped 11/08/19 1007)  LORazepam (ATIVAN) injection 1 mg (1 mg Intravenous Given 11/08/19 0912)     Initial Impression / Assessment and Plan / ED Course  I have reviewed the triage vital signs and the nursing notes.  Pertinent labs & imaging results that were available during my care of the patient were reviewed by me and considered in my medical decision making (see chart for details).        History and physical consistent with gastritis.  Basic labs, hydration, IV Zofran.  1230: Recheck.  Feeling better after 2 L of IV fluids.  Labs and ultrasound reviewed reviewed.  Discharge medication Ativan 1 mg (#10) Final Clinical Impressions(s) / ED Diagnoses   Final diagnoses:  Nausea and vomiting, intractability of vomiting not specified, unspecified vomiting type    ED Discharge Orders         Ordered    LORazepam (ATIVAN) 1 MG tablet  2 times daily PRN     11/08/19 1233           Donnetta Hutching, MD 11/08/19 6629    Donnetta Hutching, MD 11/08/19 1235

## 2019-11-08 NOTE — ED Triage Notes (Addendum)
Pt reports that she has bee nausea and vomiting for 3 days.  Reports this is 4 th episode of N/V this year. No pain. Pt has been taking zofran and phenergan. Pt reports that she had been on prednisone for a rash but stopped taking it 2 days ago

## 2019-11-12 ENCOUNTER — Telehealth: Payer: Self-pay

## 2019-11-12 NOTE — Telephone Encounter (Signed)
PCP not in this office

## 2019-11-12 NOTE — Telephone Encounter (Signed)
Copied from [place] [phone]. Topic: Appointment Scheduling - Scheduling Inquiry for Clinic >> Nov 09, 2019  1:33 PM Celene Kras wrote: Reason for CRM: Pt called stating she is having GI issues and rash. Pt is requesting to set up a virtual visit with PCP on 11/14/2019. Please advise.

## 2019-11-15 ENCOUNTER — Other Ambulatory Visit: Payer: Self-pay

## 2019-11-16 ENCOUNTER — Ambulatory Visit: Payer: Commercial Managed Care - PPO | Admitting: Family Medicine

## 2019-11-16 ENCOUNTER — Encounter: Payer: Self-pay | Admitting: Family Medicine

## 2019-11-16 VITALS — BP 90/60 | HR 90 | Temp 97.7°F | Ht 64.0 in | Wt 138.4 lb

## 2019-11-16 DIAGNOSIS — Z720 Tobacco use: Secondary | ICD-10-CM | POA: Diagnosis not present

## 2019-11-16 DIAGNOSIS — F419 Anxiety disorder, unspecified: Secondary | ICD-10-CM | POA: Diagnosis not present

## 2019-11-16 DIAGNOSIS — J4520 Mild intermittent asthma, uncomplicated: Secondary | ICD-10-CM

## 2019-11-16 DIAGNOSIS — K2970 Gastritis, unspecified, without bleeding: Secondary | ICD-10-CM

## 2019-11-16 DIAGNOSIS — R1115 Cyclical vomiting syndrome unrelated to migraine: Secondary | ICD-10-CM

## 2019-11-16 DIAGNOSIS — L503 Dermatographic urticaria: Secondary | ICD-10-CM

## 2019-11-16 DIAGNOSIS — D72829 Elevated white blood cell count, unspecified: Secondary | ICD-10-CM

## 2019-11-16 LAB — CBC WITH DIFFERENTIAL/PLATELET
Basophils Absolute: 0.1 10*3/uL (ref 0.0–0.1)
Basophils Relative: 0.9 % (ref 0.0–3.0)
Eosinophils Absolute: 0.2 10*3/uL (ref 0.0–0.7)
Eosinophils Relative: 1.9 % (ref 0.0–5.0)
HCT: 40.5 % (ref 36.0–46.0)
Hemoglobin: 13.6 g/dL (ref 12.0–15.0)
Lymphocytes Relative: 27.1 % (ref 12.0–46.0)
Lymphs Abs: 2.5 10*3/uL (ref 0.7–4.0)
MCHC: 33.5 g/dL (ref 30.0–36.0)
MCV: 101.7 fl — ABNORMAL HIGH (ref 78.0–100.0)
Monocytes Absolute: 0.9 10*3/uL (ref 0.1–1.0)
Monocytes Relative: 10.3 % (ref 3.0–12.0)
Neutro Abs: 5.5 10*3/uL (ref 1.4–7.7)
Neutrophils Relative %: 59.8 % (ref 43.0–77.0)
Platelets: 301 10*3/uL (ref 150.0–400.0)
RBC: 3.98 Mil/uL (ref 3.87–5.11)
RDW: 13.7 % (ref 11.5–15.5)
WBC: 9.3 10*3/uL (ref 4.0–10.5)

## 2019-11-16 LAB — COMPREHENSIVE METABOLIC PANEL
ALT: 12 U/L (ref 0–35)
AST: 10 U/L (ref 0–37)
Albumin: 4.1 g/dL (ref 3.5–5.2)
Alkaline Phosphatase: 47 U/L (ref 39–117)
BUN: 9 mg/dL (ref 6–23)
CO2: 26 mEq/L (ref 19–32)
Calcium: 9.1 mg/dL (ref 8.4–10.5)
Chloride: 103 mEq/L (ref 96–112)
Creatinine, Ser: 0.63 mg/dL (ref 0.40–1.20)
GFR: 106.77 mL/min (ref >60.00)
Glucose, Bld: 83 mg/dL (ref 70–99)
Potassium: 4.4 mEq/L (ref 3.5–5.1)
Sodium: 137 mEq/L (ref 135–145)
Total Bilirubin: 0.4 mg/dL (ref 0.2–1.2)
Total Protein: 6.5 g/dL (ref 6.0–8.3)

## 2019-11-16 MED ORDER — OMEPRAZOLE 40 MG PO CPDR: 40.0000 mg | DELAYED_RELEASE_CAPSULE | Freq: Every day | ORAL | 3 refills | Status: DC

## 2019-11-16 MED ORDER — PROMETHAZINE HCL 25 MG RE SUPP: 25.0000 mg | Freq: Four times a day (QID) | RECTAL | 0 refills | Status: DC | PRN

## 2019-11-16 MED ORDER — HYDROXYZINE HCL 25 MG PO TABS: 25.0000 mg | ORAL_TABLET | Freq: Three times a day (TID) | ORAL | 2 refills | Status: DC | PRN

## 2019-11-16 MED ORDER — TRIAMCINOLONE ACETONIDE 0.1 % EX LOTN: 1.0000 "application " | TOPICAL_LOTION | Freq: Three times a day (TID) | CUTANEOUS | 1 refills | Status: DC

## 2019-11-16 NOTE — Progress Notes (Signed)
Meagan Mason DOB: 09/14/1981 Encounter date: 11/16/2019  This is a 38 y.o. female who presents with Chief Complaint  Patient presents with  . Follow-up    patient stated she has had stomach distress from stress, has panic attacks and requests an Rx for Prilosec as this helped before as she is due to return to work from being out since March  . Rash    diagnosed with a stress rash by physician during an E-visit, seen at an urgent care given Prednisone which made her worse, was in the hospital and now requests a cream for this rash    History of present illness: Multiple episodes of vomiting in last year. Recently in ER 11/08/19. WBC elevated, electrolytes decreased.   In process of separation from 32yr relationship.   Was given med for omeprazole in past and thinks that if she just stayed on it it would keep her from getting past point of no return and ending up in ER.   dermatographia rash. Thought exposed something but then it was everywhere all over her. Thinks steroid would help but doesn't want to take oral due to stress. Would like topical steroid. Benadryl makes her really hyper.   prozac just increased to 30mg  yesterday. She just can't control stress, mind running. Thinks that ativan might help her calm, rest at night.  Psychiatrist is okay with prescribing this.  A couple of alcohol drinks a couple of times/week; but nothing in last week. Smoking 2-3 packs/week. Has smoked more recently. Started redrinking soda. Trying to cut out caffeine. Avoiding sugar and coffee.   Sometimes diarrhea. Thinks that anxiety triggers the nausea, then she just can't stop vomiting. Can't keep down anything. Has carafate as well and feels like this helps with the acid reflux not causing the nausea.   Her goal is to make it through the next 4 weeks of work so she can make some money and then she will have a little bit more time to dedicate to further investigation of current health issues.  She has  been smoking more during this time, but has interest in quitting.   Allergies  Allergen Reactions  . Other Shortness Of Breath and Itching    Pecans, tree nuts   . Benadryl [Diphenhydramine] Other (See Comments)    hyperactivity   Current Meds  Medication Sig  . FLUoxetine HCl (PROZAC PO) Take 30 mg by mouth daily.  loratadine (CLARITIN) 10 MG tablet Take 10 mg by mouth daily as needed for allergies.   Marland Kitchen LORazepam (ATIVAN) 1 MG tablet Take 1 tablet (1 mg total) by mouth 2 (two) times daily as needed for anxiety.  . naproxen sodium (ALEVE) 220 MG tablet Take 220 mg by mouth daily as needed.  . ondansetron (ZOFRAN) 4 MG tablet Take 1 tablet (4 mg total) by mouth every 8 (eight) hours as needed for nausea or vomiting. (Patient taking differently: Take 4 mg by mouth every 4 (four) hours as needed for nausea or vomiting. )  . promethazine (PHENERGAN) 25 MG tablet Take 1 tablet (25 mg total) by mouth every 6 (six) hours as needed for nausea or vomiting.  . [DISCONTINUED] FLUoxetine (PROZAC) 20 MG tablet Take 20 mg by mouth daily.  . [DISCONTINUED] hydrOXYzine (ATARAX/VISTARIL) 25 MG tablet Take 1 tablet (25 mg total) by mouth at bedtime as needed.  . [DISCONTINUED] potassium chloride (K-DUR) 10 MEQ tablet Take 1 tablet (10 mEq total) by mouth daily.  . [DISCONTINUED] sertraline (ZOLOFT) 50 MG tablet  Take 1 tablet (50 mg total) by mouth daily.    Review of Systems  Constitutional: Negative for chills, fatigue and fever.  Respiratory: Negative for cough, chest tightness, shortness of breath and wheezing.   Cardiovascular: Negative for chest pain, palpitations and leg swelling.  Gastrointestinal: Positive for diarrhea, nausea and vomiting.       See above.  Psychiatric/Behavioral: Positive for sleep disturbance. Negative for suicidal ideas. The patient is nervous/anxious.     Objective:  BP 90/60 (BP Location: Left Arm, Patient Position: Sitting, Cuff Size: Normal)   Pulse 90   Temp  97.7 F (36.5 C) (Temporal)   Ht 5\' 4"  (1.626 m)   Wt 138 lb 6.4 oz (62.8 kg)   LMP 10/28/2019   SpO2 98%   BMI 23.76 kg/m   Weight: 138 lb 6.4 oz (62.8 kg)   BP Readings from Last 3 Encounters:  11/16/19 90/60  11/08/19 (!) 148/69  10/22/19 137/82   Wt Readings from Last 3 Encounters:  11/16/19 138 lb 6.4 oz (62.8 kg)  11/08/19 140 lb (63.5 kg)  04/24/19 146 lb 1.6 oz (66.3 kg)    Physical Exam Constitutional:      General: She is not in acute distress.    Appearance: She is well-developed.  Cardiovascular:     Rate and Rhythm: Normal rate and regular rhythm.     Heart sounds: Normal heart sounds. No murmur. No friction rub.  Pulmonary:     Effort: Pulmonary effort is normal. No respiratory distress.     Breath sounds: Normal breath sounds. No wheezing or rales.  Abdominal:     General: Abdomen is flat. Bowel sounds are normal.     Palpations: Abdomen is soft.  Musculoskeletal:     Right lower leg: No edema.     Left lower leg: No edema.  Neurological:     Mental Status: She is alert and oriented to person, place, and time.  Psychiatric:        Behavior: Behavior normal.     Assessment/Plan  1. Cyclical vomiting Patient feels that this is very stress related she gets into cycle of increased stress, which causes vomiting, and that she has a difficult time recovering from this.  We have given her some tools to manage.  She feels that the Ativan she was given stopped the anxiety attack.  We also prescribed Phenergan suppositories so that she is able to control even when she is unable to take p.o. she is planning on following up with GI, wants to be back at work for the next 4 weeks and postpone follow-up. - Comprehensive metabolic panel; Future - promethazine (PHENERGAN) 25 MG suppository; Place 1 suppository (25 mg total) rectally every 6 (six) hours as needed for nausea or vomiting.  Dispense: 12 each; Refill: 0 - Comprehensive metabolic panel  2. Anxiety Currently  in therapy.  Recent increase in Prozac dose.  We also discussed Vistaril.  I am hoping that this helps with her urticaria, itching, anxiety and possibly with onset of sleep at night.  She will let me know how she does with this.  I have sent her a MyChart message that she can respond to. - hydrOXYzine (ATARAX/VISTARIL) 25 MG tablet; Take 1 tablet (25 mg total) by mouth 3 (three) times daily as needed.  Dispense: 90 tablet; Refill: 2  3. Tobacco abuse She has some interest in quitting.  We discussed different options to assist with quitting, as she was interested in trying Jewel.  I suggested she first look and minimize her "essential" cigarettes of the day.  She feels that going back to work will help her decrease as well but she will not smoke at work.  Would be best for her to avoid transitioning to something that mimics smoking behavior.  This can be rediscussed at follow-up visit.  4. Mild intermittent asthma without complication Asthma has been stable.  She feels that this worsens when she is more anxious and vomiting and breathing heavier.  5. Leukocytosis, unspecified type Noted on previous ER visits.  Recent lab work has been while she is sick, so we will recheck her baseline today. - CBC with Differential/Platelet; Future - CBC with Differential/Platelet  6. Gastritis without bleeding, unspecified chronicity, unspecified gastritis type Omeprazole.  Should have GI follow-up. - omeprazole (PRILOSEC) 40 MG capsule; Take 1 capsule (40 mg total) by mouth daily.  Dispense: 30 capsule; Refill: 3  7. Dermatographia Trial of Vistaril.  Suspect that urticaria reaction to stress should be better with increased Prozac dose as well.   Return for pending bloodwork.    Theodis ShoveJunell Trevel Dillenbeck, MD

## 2019-12-27 ENCOUNTER — Telehealth (INDEPENDENT_AMBULATORY_CARE_PROVIDER_SITE_OTHER): Payer: Commercial Managed Care - PPO | Admitting: Family Medicine

## 2019-12-27 ENCOUNTER — Encounter: Payer: Self-pay | Admitting: Family Medicine

## 2019-12-27 ENCOUNTER — Other Ambulatory Visit: Payer: Self-pay

## 2019-12-27 DIAGNOSIS — Z720 Tobacco use: Secondary | ICD-10-CM | POA: Diagnosis not present

## 2019-12-27 DIAGNOSIS — F419 Anxiety disorder, unspecified: Secondary | ICD-10-CM | POA: Diagnosis not present

## 2019-12-27 DIAGNOSIS — R112 Nausea with vomiting, unspecified: Secondary | ICD-10-CM | POA: Diagnosis not present

## 2019-12-27 NOTE — Progress Notes (Signed)
Virtual Visit via Video Note  I connected with Meagan Mason  on 12/27/19 at  1:00 PM EST by a video enabled telemedicine application and verified that I am speaking with the correct person using two identifiers.  Location patient: home Location provider:work or home office Persons participating in the virtual visit: patient, provider  I discussed the limitations of evaluation and management by telemedicine and the availability of in person appointments. The patient expressed understanding and agreed to proceed.   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 None since.  -she is wondering about options for FMLA for the anxiety -smokes, occ alcohol, does not answer inquiry regarding MJ/THC products, denies pregnancy ROS: See pertinent positives and negatives per HPI.  Past Medical History:  Diagnosis Date  . Allergy   . Asthma    states only when stressed out  . History of bronchitis    in college  . Neck pain    ruptured disc in neck  . URI (upper respiratory infection)    early Nov 2014  . Weakness     numbness in left arm    Past Surgical History:  Procedure Laterality Date  . WISDOM TOOTH EXTRACTION      Family History  Problem Relation Age of Onset  . Heart disease Mother   . Hypertension Mother   . Heart attack Mother 67  . Early death Father   . Cancer Father 72       stomach cancer  . Stroke Father   . Heart attack Father        After stroke  . Breast cancer Paternal Grandmother 86    SOCIAL HX: see hpi   Current Outpatient Medications:  .  FLUoxetine HCl (PROZAC PO), Take 40 mg by mouth daily. , Disp: , Rfl:  .  hydrOXYzine (ATARAX/VISTARIL) 25 MG tablet, Take 1 tablet (25 mg total) by mouth 3 (three) times daily as needed., Disp: 90 tablet, Rfl: 2 .  loratadine (CLARITIN) 10 MG tablet, Take 10 mg by mouth daily as needed for allergies. , Disp: , Rfl:  .  LORazepam (ATIVAN) 0.5 MG tablet, Take 0.5 mg by mouth. Take 0.25mg  as needed, Disp: , Rfl:  .  LORazepam (ATIVAN) 1 MG tablet, Take 1 tablet (1 mg total) by mouth 2 (two) times daily as needed for anxiety., Disp: 10 tablet, Rfl: 0 .  omeprazole (PRILOSEC) 40 MG capsule, Take 1 capsule (40 mg total) by mouth daily., Disp: 30 capsule, Rfl: 3 .  ondansetron (ZOFRAN) 4 MG tablet, Take 1 tablet (4 mg total) by mouth every 8 (eight) hours as needed for nausea or  vomiting. (Patient taking differently: Take 4 mg by mouth every 4 (four) hours as needed for nausea or vomiting. ), Disp: 20 tablet, Rfl: 0 .  promethazine (PHENERGAN) 25 MG suppository, Place 1 suppository (25 mg total) rectally every 6 (six) hours as needed for nausea or vomiting., Disp: 12 each, Rfl: 0 .  promethazine (PHENERGAN) 25 MG tablet, Take 1 tablet (25 mg total) by mouth every 6 (six) hours as needed for nausea or vomiting., Disp: 10 tablet, Rfl: 0 .  triamcinolone lotion (KENALOG) 0.1 %, Apply 1 application topically 3 (three) times daily. (Patient taking differently: Apply 1 application topically as needed. ), Disp: 60 mL, Rfl: 1  EXAM:  VITALS per  patient if applicable:  GENERAL: alert, oriented, appears well and in no acute distress  HEENT: atraumatic, conjunttiva clear, no obvious abnormalities on inspection of external nose and ears  NECK: normal movements of the head and neck  LUNGS: on inspection no signs of respiratory distress, breathing rate appears normal, no obvious gross SOB, gasping or wheezing  CV: no obvious cyanosis  MS: moves all visible extremities without noticeable abnormality  PSYCH/NEURO: pleasant and cooperative, no obvious depression or anxiety, speech and thought processing grossly intact  ASSESSMENT AND PLAN:  Discussed the following assessment and plan:  Anxiety -seeing psychiatry for management, advised of process for requesting FMLA and advise completion of forms with managing specialist, advise dto contact PCP if any difficulty with completion of these forms with specialist.  Tobacco abuse -counseled breifly  Nausea and vomiting, intractability of vomiting not specified, unspecified vomiting type -we discussed possible serious and likely etiologies, options for evaluation and workup, limitations of telemedicine visit vs in person visit, treatment, treatment risks and precautions. Pt prefers to treat via telemedicine empirically rather then risking or undertaking an in person visit at this moment. I advised evaluation with gastroenterologist to ensure no underlying etiology other than anxiety - she refused. Patient agrees to seek prompt care with PCP if worsening, new symptoms arise, she wishes to see GI or if is not improving with treatment.   I discussed the assessment and treatment plan with the patient. The patient was provided an opportunity to ask questions and all were answered. The patient agreed with the plan and demonstrated an understanding of the instructions.   The patient was advised to call back or seek an in-person evaluation if the symptoms worsen or if the condition fails to improve  as anticipated.   Terressa Koyanagi, DO

## 2019-12-27 NOTE — Patient Instructions (Signed)
Follow up with your Psychiatrist and Counselor regarding the anxiety.  You declined a referral to see a Gastroenterology specialist about the nausea and vomiting that we advised. Please follow up with your PCP if you change your mind regarding a referral or if you have worsening or new concerns or if symptoms do not resolve.  I hope you are feeling better soon! Seek care promptly if your symptoms worsen, new concerns arise or you are not improving with treatment.

## 2019-12-28 ENCOUNTER — Telehealth: Payer: Self-pay | Admitting: Internal Medicine

## 2019-12-28 ENCOUNTER — Encounter: Payer: Self-pay | Admitting: Internal Medicine

## 2019-12-28 ENCOUNTER — Encounter: Payer: Self-pay | Admitting: Adult Health

## 2019-12-28 ENCOUNTER — Other Ambulatory Visit: Payer: Self-pay

## 2019-12-28 ENCOUNTER — Telehealth (INDEPENDENT_AMBULATORY_CARE_PROVIDER_SITE_OTHER): Payer: Commercial Managed Care - PPO | Admitting: Adult Health

## 2019-12-28 DIAGNOSIS — F419 Anxiety disorder, unspecified: Secondary | ICD-10-CM | POA: Diagnosis not present

## 2019-12-28 DIAGNOSIS — R112 Nausea with vomiting, unspecified: Secondary | ICD-10-CM

## 2019-12-28 DIAGNOSIS — R1115 Cyclical vomiting syndrome unrelated to migraine: Secondary | ICD-10-CM

## 2019-12-28 NOTE — Progress Notes (Signed)
Virtual Visit via Telephone Note  I connected with Marylou Wages on 12/28/19 at  4:00 PM EST by telephone and verified that I am speaking with the correct person using two identifiers.   I discussed the limitations, risks, security and privacy concerns of performing an evaluation and management service by telephone and the availability of in person appointments. I also discussed with the patient that there may be a patient responsible charge related to this service. The patient expressed understanding and agreed to proceed.  Location patient: home Location provider: work or home office Participants present for the call: patient, provider Patient did not have a visit in the prior 7 days to address this/these issue(s).   History of Present Illness: 39 year old female who  has a past medical history of Allergy, Asthma, History of bronchitis, Neck pain, URI (upper respiratory infection), and Weakness.  She is a patient of Dr. Ardyth Harps who I am evaluating today for the first time.  He has a history of anxiety and is seen by a therapist and psychiatrist.  Currently prescribed Prozac 40 mg, this is been a slow taper and also tried on other medications in the past, also prescribed Ativan as needed.  Anxiety triggers her nausea and then she has episodes of vomiting where she cannot stop.  She cannot keep anything down when this happens and she gets dehydrated.  Recently started on Prilosec this seems to be helping but she still has episodes of nausea and vomiting; she has prescriptions for Zofran, Phenergan oral and suppository.  He was seen by Dr. Selena Batten yesterday for the same complaints which time she was wondering about FMLA options.  She contacted her work today about FMLA paperwork and they told her to contact her provider.   Observations/Objective: Patient sounds cheerful and well on the phone. I do not appreciate any SOB. Speech and thought processing are grossly intact. Patient reported  vitals:  Assessment and Plan: 1. Cyclical vomiting -We will refer to gastroenterology as her cause is not known at this time she has refused this in the past.  Not discounting anxiety related but feels as though there is something else going on that is not related to anxiety. - Ambulatory referral to Gastroenterology - Continue with current medication therapy - Follow up with PCP as needed - Advised that we do not have FMLA paperwork and this needs to come from employer, she will contact them again   2. Anxiety -Continue to follow-up with psychiatry  3. Nausea and vomiting, intractability of vomiting not specified, unspecified vomiting type - Ambulatory referral to Gastroenterology   Follow Up Instructions:  I did not refer this patient for an OV in the next 24 hours for this/these issue(s).  I discussed the assessment and treatment plan with the patient. The patient was provided an opportunity to ask questions and all were answered. The patient agreed with the plan and demonstrated an understanding of the instructions.   The patient was advised to call back or seek an in-person evaluation if the symptoms worsen or if the condition fails to improve as anticipated.  I provided 30 minutes of non-face-to-face time during this encounter.   Shirline Frees, NP

## 2019-12-28 NOTE — Addendum Note (Signed)
Addended by: Nancy Fetter on: 12/28/2019 05:03 PM   Modules accepted: Orders

## 2019-12-28 NOTE — Telephone Encounter (Signed)
Patient is requesting a letter for work, she said she just had a virtual appointment discussing her nausea saying it is not covid related.

## 2020-01-01 ENCOUNTER — Telehealth (INDEPENDENT_AMBULATORY_CARE_PROVIDER_SITE_OTHER): Payer: Commercial Managed Care - PPO | Admitting: Internal Medicine

## 2020-01-01 ENCOUNTER — Other Ambulatory Visit: Payer: Self-pay

## 2020-01-01 DIAGNOSIS — F419 Anxiety disorder, unspecified: Secondary | ICD-10-CM | POA: Diagnosis not present

## 2020-01-01 NOTE — Telephone Encounter (Signed)
Ok for note 

## 2020-01-01 NOTE — Progress Notes (Signed)
Virtual Visit via Video Note  I connected with Meagan Mason on 01/01/20 at  2:45 PM EST by a video enabled telemedicine application and verified that I am speaking with the correct person using two identifiers.  Location patient: home Location provider: work office Persons participating in the virtual visit: patient, provider  I discussed the limitations of evaluation and management by telemedicine and the availability of in person appointments. The patient expressed understanding and agreed to proceed.   HPI: She has scheduled this visit to discuss her anxiety.  I have only seen her once, since then she has seen multiple providers in this office for anxiety.  She started a new job and has had to miss a lot of work due to this.  She feels" irrational sense of panic" that then causes her to have episodes of nausea and vomiting.  Another provider referred her to GI and she has this appointment beginning of February.  She is wondering if I will fill out FMLA forms for her.  She has an appointment with psychiatry tomorrow.  She is having CBT sessions twice a week.   ROS: Constitutional: Denies fever, chills, diaphoresis, appetite change and fatigue.  HEENT: Denies photophobia, eye pain, redness, hearing loss, ear pain, congestion, sore throat, rhinorrhea, sneezing, mouth sores, trouble swallowing, neck pain, neck stiffness and tinnitus.   Respiratory: Denies SOB, DOE, cough, chest tightness,  and wheezing.   Cardiovascular: Denies chest pain, palpitations and leg swelling.  Gastrointestinal: Denies nausea, vomiting, abdominal pain, diarrhea, constipation, blood in stool and abdominal distention.  Genitourinary: Denies dysuria, urgency, frequency, hematuria, flank pain and difficulty urinating.  Endocrine: Denies: hot or cold intolerance, sweats, changes in hair or nails, polyuria, polydipsia. Musculoskeletal: Denies myalgias, back pain, joint swelling, arthralgias and gait problem.  Skin:  Denies pallor, rash and wound.  Neurological: Denies dizziness, seizures, syncope, weakness, light-headedness, numbness and headaches.  Hematological: Denies adenopathy. Easy bruising, personal or family bleeding history  Psychiatric/Behavioral: Denies suicidal ideation, mood changes, confusion, nervousness, sleep disturbance and agitation   Past Medical History:  Diagnosis Date  . Allergy   . Asthma    states only when stressed out  . History of bronchitis    in college  . Neck pain    ruptured disc in neck  . URI (upper respiratory infection)    early Nov 2014  . Weakness    numbness in left arm    Past Surgical History:  Procedure Laterality Date  . WISDOM TOOTH EXTRACTION      Family History  Problem Relation Age of Onset  . Heart disease Mother   . Hypertension Mother   . Heart attack Mother 50  . Early death Father   . Cancer Father 40       stomach cancer  . Stroke Father   . Heart attack Father        After stroke  . Breast cancer Paternal Grandmother 10    SOCIAL HX:   reports that she has been smoking cigarettes. She has a 10.00 pack-year smoking history. She has never used smokeless tobacco. She reports current alcohol use. She reports current drug use. Drug: Marijuana.   Current Outpatient Medications:  .  FLUoxetine HCl (PROZAC PO), Take 40 mg by mouth daily. , Disp: , Rfl:  .  hydrOXYzine (ATARAX/VISTARIL) 25 MG tablet, Take 1 tablet (25 mg total) by mouth 3 (three) times daily as needed., Disp: 90 tablet, Rfl: 2 .  loratadine (CLARITIN) 10 MG tablet,  Take 10 mg by mouth daily as needed for allergies. , Disp: , Rfl:  .  LORazepam (ATIVAN) 0.5 MG tablet, Take 0.5 mg by mouth. Take 0.25mg  as needed, Disp: , Rfl:  .  LORazepam (ATIVAN) 1 MG tablet, Take 1 tablet (1 mg total) by mouth 2 (two) times daily as needed for anxiety., Disp: 10 tablet, Rfl: 0 .  omeprazole (PRILOSEC) 40 MG capsule, Take 1 capsule (40 mg total) by mouth daily., Disp: 30 capsule,  Rfl: 3 .  ondansetron (ZOFRAN) 4 MG tablet, Take 1 tablet (4 mg total) by mouth every 8 (eight) hours as needed for nausea or vomiting. (Patient taking differently: Take 4 mg by mouth every 4 (four) hours as needed for nausea or vomiting. ), Disp: 20 tablet, Rfl: 0 .  promethazine (PHENERGAN) 25 MG suppository, Place 1 suppository (25 mg total) rectally every 6 (six) hours as needed for nausea or vomiting., Disp: 12 each, Rfl: 0 .  promethazine (PHENERGAN) 25 MG tablet, Take 1 tablet (25 mg total) by mouth every 6 (six) hours as needed for nausea or vomiting., Disp: 10 tablet, Rfl: 0 .  triamcinolone lotion (KENALOG) 0.1 %, Apply 1 application topically 3 (three) times daily. (Patient taking differently: Apply 1 application topically as needed. ), Disp: 60 mL, Rfl: 1  EXAM:   VITALS per patient if applicable: None reported  GENERAL: alert, oriented, appears well and in no acute distress  HEENT: atraumatic, conjunttiva clear, no obvious abnormalities on inspection of external nose and ears  NECK: normal movements of the head and neck  LUNGS: on inspection no signs of respiratory distress, breathing rate appears normal, no obvious gross increased work of breathing, gasping or wheezing  CV: no obvious cyanosis  MS: moves all visible extremities without noticeable abnormality  PSYCH/NEURO: pleasant and cooperative, no obvious depression or anxiety, speech and thought processing grossly intact  ASSESSMENT AND PLAN:   Anxiety -Since she is already followed closely by psychiatry and psychology, I feel that it would be in her best interests to continue being managed by them.  She has follow-up tomorrow morning. -I have advised her that FMLA forms need to come from her employer and that she should discuss this with psychiatry to see if they would feel comfortable filling them out due to her mental health issues.    I discussed the assessment and treatment plan with the patient. The patient was  provided an opportunity to ask questions and all were answered. The patient agreed with the plan and demonstrated an understanding of the instructions.   The patient was advised to call back or seek an in-person evaluation if the symptoms worsen or if the condition fails to improve as anticipated.    Lelon Frohlich, MD  [facility]

## 2020-01-01 NOTE — Telephone Encounter (Signed)
Okay for note

## 2020-01-02 NOTE — Telephone Encounter (Signed)
I see that pt had a note written by PCP. Called pt to see if one is still needed but no answer.

## 2020-01-03 ENCOUNTER — Encounter: Payer: Self-pay | Admitting: *Deleted

## 2020-01-03 ENCOUNTER — Telehealth: Payer: Commercial Managed Care - PPO | Admitting: Internal Medicine

## 2020-01-03 ENCOUNTER — Telehealth: Payer: Self-pay | Admitting: *Deleted

## 2020-01-03 ENCOUNTER — Telehealth: Payer: Self-pay | Admitting: Internal Medicine

## 2020-01-03 NOTE — Telephone Encounter (Signed)
Patient would like to transfer care to another provider. Okay for transfer? Patient would like to transfer to [place].

## 2020-01-03 NOTE — Telephone Encounter (Signed)
Virtual TOC made for 01/18/2020 at 10AM

## 2020-01-03 NOTE — Telephone Encounter (Signed)
Ok with me 

## 2020-01-03 NOTE — Telephone Encounter (Signed)
ok 

## 2020-01-03 NOTE — Telephone Encounter (Signed)
Will you accept patient as TOC

## 2020-01-04 ENCOUNTER — Telehealth: Payer: Commercial Managed Care - PPO | Admitting: Internal Medicine

## 2020-01-09 ENCOUNTER — Ambulatory Visit: Payer: Commercial Managed Care - PPO | Admitting: Internal Medicine

## 2020-01-10 ENCOUNTER — Other Ambulatory Visit: Payer: Self-pay

## 2020-01-10 ENCOUNTER — Ambulatory Visit: Payer: Commercial Managed Care - PPO | Attending: Internal Medicine

## 2020-01-10 DIAGNOSIS — Z20822 Contact with and (suspected) exposure to covid-19: Secondary | ICD-10-CM

## 2020-01-11 LAB — NOVEL CORONAVIRUS, NAA: SARS-CoV-2, NAA: NOT DETECTED

## 2020-01-18 ENCOUNTER — Encounter: Payer: Self-pay | Admitting: Family Medicine

## 2020-01-18 ENCOUNTER — Telehealth (INDEPENDENT_AMBULATORY_CARE_PROVIDER_SITE_OTHER): Payer: Commercial Managed Care - PPO | Admitting: Family Medicine

## 2020-01-18 ENCOUNTER — Other Ambulatory Visit: Payer: Self-pay

## 2020-01-18 DIAGNOSIS — R1115 Cyclical vomiting syndrome unrelated to migraine: Secondary | ICD-10-CM

## 2020-01-18 DIAGNOSIS — F419 Anxiety disorder, unspecified: Secondary | ICD-10-CM | POA: Diagnosis not present

## 2020-01-18 DIAGNOSIS — Z720 Tobacco use: Secondary | ICD-10-CM | POA: Diagnosis not present

## 2020-01-18 MED ORDER — PROMETHAZINE HCL 25 MG PO TABS: 25.0000 mg | ORAL_TABLET | Freq: Four times a day (QID) | ORAL | 2 refills | Status: DC | PRN

## 2020-01-18 MED ORDER — LORAZEPAM 0.5 MG PO TABS: 0.5000 mg | ORAL_TABLET | Freq: Three times a day (TID) | ORAL | Status: DC | PRN

## 2020-01-18 MED ORDER — PROMETHAZINE HCL 25 MG RE SUPP: 25.0000 mg | Freq: Four times a day (QID) | RECTAL | 1 refills | Status: DC | PRN

## 2020-01-18 MED ORDER — ONDANSETRON HCL 4 MG PO TABS: 4.0000 mg | ORAL_TABLET | Freq: Three times a day (TID) | ORAL | 2 refills | Status: DC | PRN

## 2020-01-18 NOTE — Progress Notes (Signed)
Virtual Visit via Video Note  I connected with Meagan Mason  on 01/18/20 at 10:00 AM EST by a video enabled telemedicine application and verified that I am speaking with the correct person using two identifiers.  Location patient: home Location provider:work office Persons participating in the virtual visit: patient, provider  I discussed the limitations of evaluation and management by telemedicine and the availability of in person appointments. The patient expressed understanding and agreed to proceed.   Meagan Mason DOB: 1981-10-23 Encounter date: 01/18/2020  This is a 39 y.o. female who presents to establish care. Chief Complaint  Patient presents with  . Establish Care    transfer from Dr Jerilee Hoh    History of present illness: Since her last visit with me, everything was going ok. She went back to work. Started having more frequent, but less severe "cluster episodes". In past would have severe episodes where she ended up in ER and would be out for a week. These would put her down for a day or so. There were a couple mornings where it took her awhile to get to work. Sweating, vomiting episodes. Got to point where she was having more panic attacks about it. Even when not having episode got to point where she was worried about having episode.   Around Christmas was where she felt like things were unmanageable. Tried to be pro-active and get note from doctor. Was looking into intermittent FMLA due to symptoms. Everything really started in May 2020. Didn't realize how much it would affect work until she went back to work.   Employer wouldn't give her FMLA paperwork. Psychiatrist decided to push forward with plan for FMLA. At this point she is not at work and working with psychiatrist to get her to point where she can get back to work. Feels like she is just having constant panic attack x 3 weeks. Psychiatrist is with [facility]. Also seeing therapist weekly or more.  Employer just gave her FMLA form after weeks of asking. They are helping her with all of the paperwork right now.   Starting to run out of medications (like anti-nausea).  Found device called relief band which looks like fit bit and she wears it which helps send signal to brain to help with nausea, vomiting. Psychiatrist was able to help her with getting this.   Looking into getting therapy/service dog.   Right now is having to pack up things of hers and ex. Ex is caring for father who had significant COVID infection and post illness complications. She is working to get this done, but keeps having episodes, crying, vomiting, etc.   Kept backing out of GI appointments when she would feel ok, but now happening more so she does have appointment with [facility] march 3rd.   Does feel like variety of nausea medications is helpful. Phenergan gives her better relief, but harder to keep down. Zofran seems to help a little faster. If she still can't keep down then uses suppository. Also gives her vertigo, chest tightness, tingling in arms. Next day feels physically sore from episodes.  Everything seems like it sets off her anxiety.   Past Medical History:  Diagnosis Date  . Allergy   . Asthma    states only when stressed out  . History of bronchitis    in college  . Neck pain    ruptured disc in neck  . URI (upper respiratory infection)    early Nov 2014  . Weakness  numbness in left arm   Past Surgical History:  Procedure Laterality Date  . WISDOM TOOTH EXTRACTION     Allergies  Allergen Reactions  . Other Shortness Of Breath and Itching    Pecans, tree nuts   . Benadryl [Diphenhydramine] Other (See Comments)    hyperactivity   Current Meds  Medication Sig  . ALBUTEROL IN Inhale into the lungs.  Marland Kitchen FLUoxetine HCl (PROZAC PO) Take 60 mg by mouth daily.   . hydrOXYzine (ATARAX/VISTARIL) 25 MG tablet Take 1 tablet (25 mg total) by mouth 3 (three) times daily as needed.  . loratadine  (CLARITIN) 10 MG tablet Take 10 mg by mouth daily as needed for allergies.   Marland Kitchen LORazepam (ATIVAN) 0.5 MG tablet Take 0.5 mg by mouth. Take 0.25mg  as needed  . LORazepam (ATIVAN) 1 MG tablet Take 1 tablet (1 mg total) by mouth 2 (two) times daily as needed for anxiety.  Marland Kitchen omeprazole (PRILOSEC) 40 MG capsule Take 1 capsule (40 mg total) by mouth daily.  . ondansetron (ZOFRAN) 4 MG tablet Take 1 tablet (4 mg total) by mouth every 8 (eight) hours as needed for nausea or vomiting. (Patient taking differently: Take 4 mg by mouth every 4 (four) hours as needed for nausea or vomiting. )  . promethazine (PHENERGAN) 25 MG suppository Place 1 suppository (25 mg total) rectally every 6 (six) hours as needed for nausea or vomiting.  . promethazine (PHENERGAN) 25 MG tablet Take 1 tablet (25 mg total) by mouth every 6 (six) hours as needed for nausea or vomiting.  . triamcinolone lotion (KENALOG) 0.1 % Apply 1 application topically 3 (three) times daily. (Patient taking differently: Apply 1 application topically as needed. )   Social History   Tobacco Use  . Smoking status: Current Every Day Smoker    Packs/day: 1.00    Years: 10.00    Pack years: 10.00    Types: Cigarettes  . Smokeless tobacco: Never Used  Substance Use Topics  . Alcohol use: Yes    Comment: wine 2-3 times a week   Family History  Problem Relation Age of Onset  . Heart disease Mother   . Hypertension Mother   . Heart attack Mother 37  . Early death Father   . Cancer Father 72       stomach cancer  . Stroke Father   . Heart attack Father        After stroke  . Breast cancer Paternal Grandmother 14     Review of Systems  Respiratory: Negative for cough, shortness of breath and wheezing.   Gastrointestinal: Positive for abdominal pain, diarrhea, nausea and vomiting.  Psychiatric/Behavioral: The patient is nervous/anxious.     Objective:  LMP 01/08/2020 (Approximate)       BP Readings from Last 3 Encounters:  11/16/19  90/60  11/08/19 (!) 148/69  10/22/19 137/82   Wt Readings from Last 3 Encounters:  11/16/19 138 lb 6.4 oz (62.8 kg)  11/08/19 140 lb (63.5 kg)  04/24/19 146 lb 1.6 oz (66.3 kg)    EXAM:  GENERAL: alert, oriented, appears well and in no acute distress  HEENT: atraumatic, conjunctiva clear, no obvious abnormalities on inspection of external nose and ears  NECK: normal movements of the head and neck  LUNGS: on inspection no signs of respiratory distress, breathing rate appears normal, no obvious gross SOB, gasping or wheezing  CV: no obvious cyanosis  MS: moves all visible extremities without noticeable abnormality  PSYCH/NEURO: pleasant and cooperative,  no obvious depression, speech and thought processing grossly intact.  Speech is rapid, patient does appear anxious.   Assessment/Plan  1. Anxiety She is seeing psychiatry and psychology regularly.  They have kept her out of work at this time.  We discussed that her chronic cannabis use may be contributing to cyclical vomiting and actually worsening medical picture.  She will continue to work with her specialist.  They are managing her FMLA paperwork at this time.  2. Tobacco abuse She is working on getting mentally set to quit smoking.  She has considered quitting marijuana use as well.  We will continue to discuss.  She does have some tools in place to start this, including substitution inhalers, but she is also using these for regular cannabis use.  3. Cyclical vomiting She does have an upcoming appointment with GI.  I encouraged her to keep this.  See above.  I told her I will follow along with their evaluation so we can assist with any further work-up needed.  Medications for nausea refilled today for her. - promethazine (PHENERGAN) 25 MG suppository; Place 1 suppository (25 mg total) rectally every 6 (six) hours as needed for nausea or vomiting.  Dispense: 12 each; Refill: 1 - promethazine (PHENERGAN) 25 MG tablet; Take 1  tablet (25 mg total) by mouth every 6 (six) hours as needed for nausea or vomiting.  Dispense: 30 tablet; Refill: 2 - ondansetron (ZOFRAN) 4 MG tablet; Take 1 tablet (4 mg total) by mouth every 8 (eight) hours as needed for nausea or vomiting.  Dispense: 30 tablet; Refill: 2    I discussed the assessment and treatment plan with the patient. The patient was provided an opportunity to ask questions and all were answered. The patient agreed with the plan and demonstrated an understanding of the instructions.   The patient was advised to call back or seek an in-person evaluation if the symptoms worsen or if the condition fails to improve as anticipated.  I provided 30 minutes of non-face-to-face time during this encounter.   Theodis Shove, MD

## 2020-02-05 ENCOUNTER — Telehealth: Payer: Self-pay | Admitting: Internal Medicine

## 2020-02-05 NOTE — Telephone Encounter (Signed)
Pt stated that she usually gets the Zofran ODT dissolvable tablet but when she picked up her px today it was a pill form. She said that she usually has trouble taking the pill when she is nauseous.   Pt would like to know if the dissolvable pill can be called in. She is in no rush.  Pharmacy:  [facility] [address] FAX: [phone]   Pt can be reached at [phone] if needed

## 2020-02-06 MED ORDER — ONDANSETRON 4 MG PO TBDP: 4.0000 mg | ORAL_TABLET | Freq: Three times a day (TID) | ORAL | 0 refills | Status: DC | PRN

## 2020-02-06 NOTE — Telephone Encounter (Signed)
Spoke with the pt and informed her of the message below.  Patient is aware the Rx was sent to [facility].

## 2020-02-06 NOTE — Telephone Encounter (Signed)
I am ok with this; but I did give you paper last week that her insurance was limiting supply of the zofran. Might need to get from specialist due to frequency of use.  Please try to send in the ODT for her.

## 2020-02-21 ENCOUNTER — Other Ambulatory Visit: Payer: Self-pay | Admitting: Family Medicine

## 2020-02-21 DIAGNOSIS — K2970 Gastritis, unspecified, without bleeding: Secondary | ICD-10-CM

## 2020-02-21 NOTE — Telephone Encounter (Signed)
Medication Refill: Omeprazole 40 MG Pharmacy: [id number] - [place], [place] - [address] AT [address] [facility] [address] Phone:[phone]    Pt would like to know if she needs to request medication each month or will multiply refills be sent since she takes them daily. Pt is ok with a response sent to her mychart.

## 2020-02-21 NOTE — Telephone Encounter (Signed)
Refill sent.

## 2020-04-02 ENCOUNTER — Ambulatory Visit: Payer: Commercial Managed Care - PPO | Admitting: Internal Medicine

## 2020-05-08 ENCOUNTER — Other Ambulatory Visit: Payer: Self-pay | Admitting: Family Medicine

## 2020-05-28 ENCOUNTER — Telehealth: Payer: Commercial Managed Care - PPO | Admitting: Family

## 2020-05-28 DIAGNOSIS — L255 Unspecified contact dermatitis due to plants, except food: Secondary | ICD-10-CM

## 2020-05-28 MED ORDER — CLOBETASOL PROPIONATE 0.05 % EX CREA: 1.0000 "application " | TOPICAL_CREAM | Freq: Two times a day (BID) | CUTANEOUS | 0 refills | Status: DC

## 2020-05-28 MED ORDER — PREDNISONE 10 MG (21) PO TBPK: ORAL_TABLET | ORAL | 0 refills | Status: DC

## 2020-05-28 NOTE — Progress Notes (Signed)

## 2020-06-15 ENCOUNTER — Encounter (HOSPITAL_COMMUNITY): Payer: Self-pay | Admitting: Emergency Medicine

## 2020-06-15 ENCOUNTER — Other Ambulatory Visit: Payer: Self-pay

## 2020-06-15 ENCOUNTER — Emergency Department (HOSPITAL_COMMUNITY)
Admission: EM | Admit: 2020-06-15 | Discharge: 2020-06-15 | Disposition: A | Discharge status: Home / Self Care | Payer: 59 | Attending: Emergency Medicine | Admitting: Emergency Medicine

## 2020-06-15 DIAGNOSIS — Z79899 Other long term (current) drug therapy: Secondary | ICD-10-CM | POA: Diagnosis not present

## 2020-06-15 DIAGNOSIS — R197 Diarrhea, unspecified: Secondary | ICD-10-CM | POA: Diagnosis not present

## 2020-06-15 DIAGNOSIS — F17210 Nicotine dependence, cigarettes, uncomplicated: Secondary | ICD-10-CM | POA: Insufficient documentation

## 2020-06-15 DIAGNOSIS — J45909 Unspecified asthma, uncomplicated: Secondary | ICD-10-CM | POA: Insufficient documentation

## 2020-06-15 DIAGNOSIS — R112 Nausea with vomiting, unspecified: Secondary | ICD-10-CM | POA: Diagnosis not present

## 2020-06-15 DIAGNOSIS — Z7951 Long term (current) use of inhaled steroids: Secondary | ICD-10-CM | POA: Diagnosis not present

## 2020-06-15 DIAGNOSIS — R1110 Vomiting, unspecified: Secondary | ICD-10-CM | POA: Diagnosis present

## 2020-06-15 HISTORY — DX: Depression, unspecified: F32.A

## 2020-06-15 HISTORY — DX: Anxiety disorder, unspecified: F41.9

## 2020-06-15 LAB — URINALYSIS, ROUTINE W REFLEX MICROSCOPIC
Bilirubin Urine: NEGATIVE
Glucose, UA: NEGATIVE mg/dL
Ketones, ur: 80 mg/dL — AB
Leukocytes,Ua: NEGATIVE
Nitrite: NEGATIVE
Protein, ur: NEGATIVE mg/dL
Specific Gravity, Urine: 1.016 (ref 1.005–1.030)
pH: 6 (ref 5.0–8.0)

## 2020-06-15 LAB — CBC WITH DIFFERENTIAL/PLATELET
Abs Immature Granulocytes: 0.04 10*3/uL (ref 0.00–0.07)
Basophils Absolute: 0.1 10*3/uL (ref 0.0–0.1)
Basophils Relative: 0 %
Eosinophils Absolute: 0 10*3/uL (ref 0.0–0.5)
Eosinophils Relative: 0 %
HCT: 43.6 % (ref 36.0–46.0)
Hemoglobin: 15.4 g/dL — ABNORMAL HIGH (ref 12.0–15.0)
Immature Granulocytes: 0 %
Lymphocytes Relative: 8 %
Lymphs Abs: 1 10*3/uL (ref 0.7–4.0)
MCH: 35.3 pg — ABNORMAL HIGH (ref 26.0–34.0)
MCHC: 35.3 g/dL (ref 30.0–36.0)
MCV: 100 fL (ref 80.0–100.0)
Monocytes Absolute: 1.4 10*3/uL — ABNORMAL HIGH (ref 0.1–1.0)
Monocytes Relative: 10 %
Neutro Abs: 11 10*3/uL — ABNORMAL HIGH (ref 1.7–7.7)
Neutrophils Relative %: 82 %
Platelets: 356 10*3/uL (ref 150–400)
RBC: 4.36 MIL/uL (ref 3.87–5.11)
RDW: 12.2 % (ref 11.5–15.5)
WBC: 13.6 10*3/uL — ABNORMAL HIGH (ref 4.0–10.5)
nRBC: 0 % (ref 0.0–0.2)

## 2020-06-15 LAB — COMPREHENSIVE METABOLIC PANEL
ALT: 14 U/L (ref 0–44)
AST: 15 U/L (ref 15–41)
Albumin: 3.8 g/dL (ref 3.5–5.0)
Alkaline Phosphatase: 61 U/L (ref 38–126)
Anion gap: 18 — ABNORMAL HIGH (ref 5–15)
BUN: 10 mg/dL (ref 6–20)
CO2: 21 mmol/L — ABNORMAL LOW (ref 22–32)
Calcium: 9.3 mg/dL (ref 8.9–10.3)
Chloride: 98 mmol/L (ref 98–111)
Creatinine, Ser: 0.72 mg/dL (ref 0.44–1.00)
GFR calc Af Amer: >60 mL/min (ref >60)
GFR calc non Af Amer: >60 mL/min (ref >60)
Glucose, Bld: 144 mg/dL — ABNORMAL HIGH (ref 70–99)
Potassium: 2.7 mmol/L — CL (ref 3.5–5.1)
Sodium: 137 mmol/L (ref 135–145)
Total Bilirubin: 0.7 mg/dL (ref 0.3–1.2)
Total Protein: 7.9 g/dL (ref 6.5–8.1)

## 2020-06-15 LAB — LIPASE, BLOOD: Lipase: 20 U/L (ref 11–51)

## 2020-06-15 LAB — PREGNANCY, URINE: Preg Test, Ur: NEGATIVE

## 2020-06-15 MED ORDER — SODIUM CHLORIDE 0.9 % IV BOLUS
1000.0000 mL | Freq: Once | INTRAVENOUS | Status: AC
  Administered 2020-06-15: 1000 mL via INTRAVENOUS

## 2020-06-15 MED ORDER — POTASSIUM CHLORIDE 10 MEQ/100ML IV SOLN
10.0000 meq | INTRAVENOUS | Status: AC
  Administered 2020-06-15 (×2): 10 meq via INTRAVENOUS
  Filled 2020-06-15 (×2): qty 100

## 2020-06-15 MED ORDER — POTASSIUM CHLORIDE ER 10 MEQ PO CPCR: 20.0000 meq | ORAL_CAPSULE | Freq: Two times a day (BID) | ORAL | 0 refills | Status: DC

## 2020-06-15 MED ORDER — HALOPERIDOL LACTATE 5 MG/ML IJ SOLN
2.5000 mg | Freq: Once | INTRAMUSCULAR | Status: AC
  Administered 2020-06-15: 2.5 mg via INTRAVENOUS
  Filled 2020-06-15: qty 1

## 2020-06-15 NOTE — ED Triage Notes (Signed)
Patient c/o nausea, vomiting, and diarrhea x1 week. Denies any fevers. Per patient chronic issue related to stress. Per patient has Zofran, phenergan, and suppositories but vomiting and diarrhea has been "so bad unable to keep them down or in."

## 2020-06-15 NOTE — ED Provider Notes (Addendum)
[facility] EMERGENCY DEPARTMENT Provider Note   CSN: [id number] Arrival date & time: 06/15/20  1508     History Chief Complaint  Patient presents with  . Emesis    Meagan Mason is a 39 y.o. female with history of anxiety, cyclical vomiting, presents to the ED for evaluation of nausea associated with multiple episodes of vomiting and diarrhea for the last week.  Reports sweating from forceful vomiting and diarrhea but denies any fevers or chills.  States this is another one of her "episodes".  States this usually happens when she is under stress.  Admits to recent stress at "life".  States sometimes she vomits so much that she gets dehydrated and needs IV fluids.  Has Ativan at home for anxiety.  Takes Zofran and Phenergan suppositories as needed but these have not helped.  No blood in the vomit or in the stools.  She has generalized abdominal pain like she just had a bunch of crunches.  Admits to frequent use of marijuana.  Daily drinker, usually drinks 1-2 beers and a glass of wine daily.  Had a telemedicine visit with gastroenterology who prescribed her sucralfate but per her report did not think she needed an endoscopy.  Not vaccinated for Covid, states this is not Covid and she does not want the vaccine.  No sick contacts.  No chest pain or shortness of breath or cough. HPI     Past Medical History:  Diagnosis Date  . Allergy   . Anxiety   . Asthma    states only when stressed out  . Depression   . History of bronchitis    in college  . Neck pain    ruptured disc in neck  . URI (upper respiratory infection)    early Nov 2014  . Weakness    numbness in left arm    Patient Active Problem List   Diagnosis Date Noted  . Anxiety 04/24/2019  . Head congestion 10/30/2018  . Gastritis 02/06/2016  . Tobacco abuse 02/06/2016  . Health care maintenance 02/06/2016    Past Surgical History:  Procedure Laterality Date  . WISDOM TOOTH EXTRACTION       OB History    Gravida    0   Para  0   Term  0   Preterm  0   AB  0   Living  0     SAB  0   TAB  0   Ectopic  0   Multiple  0   Live Births  0           Family History  Problem Relation Age of Onset  . Heart disease Mother   . Hypertension Mother   . Heart attack Mother 85  . Early death Father   . Cancer Father 69       stomach cancer  . Stroke Father   . Heart attack Father        After stroke  . Breast cancer Paternal Grandmother 68    Social History   Tobacco Use  . Smoking status: Current Every Day Smoker    Packs/day: 1.00    Years: 10.00    Pack years: 10.00    Types: Cigarettes  . Smokeless tobacco: Never Used  Vaping Use  . Vaping Use: Never used  Substance Use Topics  . Alcohol use: Yes    Comment: wine 2-3 times a week  . Drug use: Yes    Types: Marijuana  Home Medications Prior to Admission medications   Medication Sig Start Date End Date Taking? Authorizing Provider  ALBUTEROL IN Inhale into the lungs.   Yes [provider]  clobetasol cream (TEMOVATE) 0.05 % Apply 1 application topically 2 (two) times daily. 05/28/20  Yes Hawks, Christy A, FNP  FLUoxetine (PROZAC) 20 MG capsule Take 60 mg by mouth every morning. 05/08/20  Yes [provider]  loratadine (CLARITIN) 10 MG tablet Take 10 mg by mouth daily as needed for allergies.    Yes [provider]  LORazepam (ATIVAN) 0.5 MG tablet Take 1 tablet (0.5 mg total) by mouth 3 (three) times daily as needed for anxiety. FROM PSYCHIATRY 01/18/20  Yes Koberlein, Junell C, MD  omeprazole (PRILOSEC) 40 MG capsule TAKE 1 CAPSULE BY MOUTH EVERY DAY 02/21/20  Yes Koberlein, Junell C, MD  potassium chloride (MICRO-K) 10 MEQ CR capsule Take 2 capsules (20 mEq total) by mouth 2 (two) times daily. 06/15/20   Liberty Handy, PA-C  CARAFATE 1 GM/10ML suspension Take 10 mLs by mouth 4 (four) times daily. 07/05/19 10/22/19  [provider]  metoCLOPramide (REGLAN) 10 MG tablet Take 1 tablet  (10 mg total) by mouth every 6 (six) hours as needed for nausea or vomiting. 07/06/19 10/22/19  Loren Racer, MD    Allergies    Other and Benadryl [diphenhydramine]  Review of Systems   Review of Systems  Constitutional: Positive for chills.  Gastrointestinal: Positive for abdominal pain, diarrhea, nausea and vomiting.  All other systems reviewed and are negative.   Physical Exam Updated Vital Signs BP (!) 142/92 (BP Location: Right Arm)   Pulse 89   Temp 97.6 F (36.4 C) (Oral)   Resp 17   Ht 5\' 4"  (1.626 m)   Wt 63.5 kg   LMP 05/25/2020   SpO2 100%   BMI 24.03 kg/m   Physical Exam Vitals and nursing note reviewed.  Constitutional:      Appearance: She is well-developed.     Comments: Nontoxic but appears uncomfortable, dry heaving.  HENT:     Head: Normocephalic and atraumatic.     Nose: Nose normal.  Eyes:     Conjunctiva/sclera: Conjunctivae normal.  Cardiovascular:     Rate and Rhythm: Regular rhythm. Tachycardia present.  Pulmonary:     Effort: Pulmonary effort is normal.     Breath sounds: Normal breath sounds.  Abdominal:     General: Bowel sounds are normal.     Palpations: Abdomen is soft.     Tenderness: There is no abdominal tenderness.     Comments: No G/R/R. No suprapubic or CVA tenderness. Negative Murphy's and McBurney's. Active BS to lower quadrants.   Musculoskeletal:        General: Normal range of motion.     Cervical back: Normal range of motion.  Skin:    General: Skin is warm and dry.     Capillary Refill: Capillary refill takes less than 2 seconds.  Neurological:     Mental Status: She is alert.  Psychiatric:        Behavior: Behavior normal.     ED Results / Procedures / Treatments   Labs (all labs ordered are listed, but only abnormal results are displayed) Labs Reviewed  CBC WITH DIFFERENTIAL/PLATELET - Abnormal; Notable for the following components:      Result Value   WBC 13.6 (*)    Hemoglobin 15.4 (*)    MCH 35.3  (*)    Neutro Abs 11.0 (*)  Monocytes Absolute 1.4 (*)    All other components within normal limits  COMPREHENSIVE METABOLIC PANEL - Abnormal; Notable for the following components:   Potassium 2.7 (*)    CO2 21 (*)    Glucose, Bld 144 (*)    Anion gap 18 (*)    All other components within normal limits  LIPASE, BLOOD  URINALYSIS, ROUTINE W REFLEX MICROSCOPIC  PREGNANCY, URINE    EKG None  Radiology No results found.  Procedures Procedures (including critical care time)  Medications Ordered in ED Medications  potassium chloride 10 mEq in 100 mL IVPB (10 mEq Intravenous New Bag/Given 06/15/20 1815)  haloperidol lactate (HALDOL) injection 2.5 mg (2.5 mg Intravenous Given 06/15/20 1650)  sodium chloride 0.9 % bolus 1,000 mL (0 mLs Intravenous Stopped 06/15/20 1813)  sodium chloride 0.9 % bolus 1,000 mL (1,000 mLs Intravenous New Bag/Given 06/15/20 1814)    ED Course  I have reviewed the triage vital signs and the nursing notes.  Pertinent labs & imaging results that were available during my care of the patient were reviewed by me and considered in my medical decision making (see chart for details).  Clinical Course as of Jun 16 1831  Wynelle Link Jun 15, 2020  1644 Potassium(!!): 2.7 [CG]  1645 WBC(!): 13.6 [CG]    Clinical Course User Index [CG] Jerrell Mylar   MDM Rules/Calculators/A&P                          I obtained additional history from triage, nursing notes and review of medical chart.  Previous medical records available, nursing notes reviewed to obtain more history and assist with MDM  Patient seen several times for cyclical vomiting by PCP and in the ED.  Unable to access gastroenterology notes.     The differential diagnosis includes viral gastroenteritis, marijuana induced hyperemesis syndrome, alcohol induced gastritis.  She is on Prilosec.  I ordered medications including Haldol, IV fluids.  I ordered laboratory studies including screening lab  work, urine pregnancy.  I ordered EKG to evaluate QTC.  Patient actually has no abdominal tenderness on my exam.  Given this, and chronicity of symptoms, I doubt acute intra-abdominal or surgical process like pancreatitis, cholecystitis, diverticulitis, appendicitis, SBO.  Will defer imaging at this time.  1815: Reevaluated patient and he has had significant improvement.  Tachycardia has resolved with IV fluids, nausea control.  ER work-up personally visualized and interpreted.  Hypokalemia K2.7.  Mild leukocytosis.  Given clinical improvement, chronicity of symptoms, I do not think further emergent lab work is indicated today.  We will finish IVK, IV fluids, p.o. challenge.  Anticipate discharge.  Patient has several antiemetics, Ativan at home to use as needed.  Discussed plan with patient who is in agreement.  We will handed patient off to oncoming ED PA who will discharge after IV meds are finished.  Pending urine pregnancy.    Final Clinical Impression(s) / ED Diagnoses Final diagnoses:  Nausea vomiting and diarrhea    Rx / DC Orders ED Discharge Orders         Ordered    potassium chloride (MICRO-K) 10 MEQ CR capsule  2 times daily     Discontinue  Reprint     06/15/20 1832             Liberty Handy, PA-C 06/15/20 1832    Vanetta Mulders, MD 06/18/20 [phone]

## 2020-06-15 NOTE — ED Notes (Signed)
Pt refused EKG at this time.  States that her heart is fine.

## 2020-06-15 NOTE — ED Provider Notes (Signed)
Patient signed out to me by Sharen Heck, PA-C at end of shift pending completion of work-up.  Patient has received IV fluids and IV potassium.  Tolerated oral fluid challenge.  Reports feeling better.  Urinalysis unremarkable for acute infection, urine pregnancy is negative. Pt refused EKG.  Is felt that patient is appropriate for discharge home.  She agrees to oral potassium supplementation and will have this rechecked by PCP.  Labs Reviewed  CBC WITH DIFFERENTIAL/PLATELET - Abnormal; Notable for the following components:      Result Value   WBC 13.6 (*)    Hemoglobin 15.4 (*)    MCH 35.3 (*)    Neutro Abs 11.0 (*)    Monocytes Absolute 1.4 (*)    All other components within normal limits  COMPREHENSIVE METABOLIC PANEL - Abnormal; Notable for the following components:   Potassium 2.7 (*)    CO2 21 (*)    Glucose, Bld 144 (*)    Anion gap 18 (*)    All other components within normal limits  URINALYSIS, ROUTINE W REFLEX MICROSCOPIC - Abnormal; Notable for the following components:   APPearance HAZY (*)    Hgb urine dipstick MODERATE (*)    Ketones, ur 80 (*)    Bacteria, UA RARE (*)    All other components within normal limits  LIPASE, BLOOD  PREGNANCY, URINE      Pauline Aus, PA-C 06/15/20 2014    Vanetta Mulders, MD 06/18/20 1640

## 2020-06-15 NOTE — Discharge Instructions (Signed)
You were seen in the ED for nausea, vomiting, diarrhea  Your potassium was low, you were given this to your IV.  Please take potassium supplements for the next 7 days.  Increase your intake of potassium rich foods.  It is unclear the exact cause of your recurrent nausea, vomiting and GI issues.  However, frequent alcohol use and marijuana use can definitely contribute to gastritis or hyperemesis syndrome.  Consider cutting back on these.  Return to the ED for worsening symptoms, nonstop vomiting, blood in your vomit, blood in your stool, fevers, chest pain or shortness of breath

## 2020-06-15 NOTE — ED Notes (Signed)
Pt in bed, sig other at bedside, pt denies pain or nausea, pt states that she is ready to go home, pt requests more ice chip, ice chips given, d/c pt iv, cath intact, pt verbalized understanding d/c instructions and follow up, pt from dpt

## 2020-06-17 ENCOUNTER — Emergency Department (HOSPITAL_COMMUNITY)
Admission: EM | Admit: 2020-06-17 | Discharge: 2020-06-17 | Disposition: A | Discharge status: Home / Self Care | Payer: 59

## 2020-06-17 ENCOUNTER — Other Ambulatory Visit: Payer: Self-pay

## 2020-06-17 NOTE — ED Notes (Signed)
Left at 1658, not available for triage

## 2020-07-30 ENCOUNTER — Emergency Department (HOSPITAL_COMMUNITY)
Admission: EM | Admit: 2020-07-30 | Discharge: 2020-07-31 | Disposition: A | Discharge status: Home / Self Care | Payer: 59 | Attending: Emergency Medicine | Admitting: Emergency Medicine

## 2020-07-30 ENCOUNTER — Encounter (HOSPITAL_COMMUNITY): Payer: Self-pay | Admitting: Emergency Medicine

## 2020-07-30 ENCOUNTER — Other Ambulatory Visit: Payer: Self-pay

## 2020-07-30 DIAGNOSIS — F1290 Cannabis use, unspecified, uncomplicated: Secondary | ICD-10-CM | POA: Diagnosis not present

## 2020-07-30 DIAGNOSIS — F17210 Nicotine dependence, cigarettes, uncomplicated: Secondary | ICD-10-CM | POA: Insufficient documentation

## 2020-07-30 DIAGNOSIS — F10920 Alcohol use, unspecified with intoxication, uncomplicated: Secondary | ICD-10-CM | POA: Diagnosis not present

## 2020-07-30 DIAGNOSIS — J45909 Unspecified asthma, uncomplicated: Secondary | ICD-10-CM | POA: Insufficient documentation

## 2020-07-30 DIAGNOSIS — Z7951 Long term (current) use of inhaled steroids: Secondary | ICD-10-CM | POA: Diagnosis not present

## 2020-07-30 DIAGNOSIS — R456 Violent behavior: Secondary | ICD-10-CM | POA: Diagnosis present

## 2020-07-30 LAB — COMPREHENSIVE METABOLIC PANEL
ALT: 19 U/L (ref 0–44)
AST: 21 U/L (ref 15–41)
Albumin: 4.5 g/dL (ref 3.5–5.0)
Alkaline Phosphatase: 50 U/L (ref 38–126)
Anion gap: 15 (ref 5–15)
BUN: 8 mg/dL (ref 6–20)
CO2: 20 mmol/L — ABNORMAL LOW (ref 22–32)
Calcium: 8.8 mg/dL — ABNORMAL LOW (ref 8.9–10.3)
Chloride: 105 mmol/L (ref 98–111)
Creatinine, Ser: 0.56 mg/dL (ref 0.44–1.00)
GFR calc Af Amer: >60 mL/min (ref >60)
GFR calc non Af Amer: >60 mL/min (ref >60)
Glucose, Bld: 82 mg/dL (ref 70–99)
Potassium: 4 mmol/L (ref 3.5–5.1)
Sodium: 140 mmol/L (ref 135–145)
Total Bilirubin: 0.3 mg/dL (ref 0.3–1.2)
Total Protein: 8.3 g/dL — ABNORMAL HIGH (ref 6.5–8.1)

## 2020-07-30 LAB — CBC
HCT: 41.3 % (ref 36.0–46.0)
Hemoglobin: 14.2 g/dL (ref 12.0–15.0)
MCH: 34.7 pg — ABNORMAL HIGH (ref 26.0–34.0)
MCHC: 34.4 g/dL (ref 30.0–36.0)
MCV: 101 fL — ABNORMAL HIGH (ref 80.0–100.0)
Platelets: 337 10*3/uL (ref 150–400)
RBC: 4.09 MIL/uL (ref 3.87–5.11)
RDW: 12.5 % (ref 11.5–15.5)
WBC: 11.7 10*3/uL — ABNORMAL HIGH (ref 4.0–10.5)
nRBC: 0 % (ref 0.0–0.2)

## 2020-07-30 LAB — ETHANOL: Alcohol, Ethyl (B): 249 mg/dL — ABNORMAL HIGH (ref <10)

## 2020-07-30 MED ORDER — ZOLPIDEM TARTRATE 5 MG PO TABS: 5.0000 mg | ORAL_TABLET | Freq: Every evening | ORAL | Status: DC | PRN

## 2020-07-30 MED ORDER — ACETAMINOPHEN 325 MG PO TABS: 650.0000 mg | ORAL_TABLET | ORAL | Status: DC | PRN

## 2020-07-30 MED ORDER — LORAZEPAM 1 MG PO TABS: 0.0000 mg | ORAL_TABLET | Freq: Four times a day (QID) | ORAL | Status: DC

## 2020-07-30 MED ORDER — THIAMINE HCL 100 MG/ML IJ SOLN: 100.0000 mg | Freq: Every day | INTRAMUSCULAR | Status: DC

## 2020-07-30 MED ORDER — THIAMINE HCL 100 MG PO TABS: 100.0000 mg | ORAL_TABLET | Freq: Every day | ORAL | Status: DC

## 2020-07-30 MED ORDER — NICOTINE 21 MG/24HR TD PT24: 21.0000 mg | MEDICATED_PATCH | Freq: Every day | TRANSDERMAL | Status: DC

## 2020-07-30 MED ORDER — HALOPERIDOL 5 MG PO TABS: 5.0000 mg | ORAL_TABLET | Freq: Four times a day (QID) | ORAL | Status: DC | PRN

## 2020-07-30 MED ORDER — LORAZEPAM 1 MG PO TABS: 0.0000 mg | ORAL_TABLET | Freq: Two times a day (BID) | ORAL | Status: DC

## 2020-07-30 MED ORDER — LORAZEPAM 2 MG/ML IJ SOLN: 0.0000 mg | Freq: Four times a day (QID) | INTRAMUSCULAR | Status: DC

## 2020-07-30 MED ORDER — LORAZEPAM 2 MG/ML IJ SOLN: 0.0000 mg | Freq: Two times a day (BID) | INTRAMUSCULAR | Status: DC

## 2020-07-30 MED ORDER — BENZTROPINE MESYLATE 1 MG PO TABS: 1.0000 mg | ORAL_TABLET | Freq: Four times a day (QID) | ORAL | Status: DC | PRN

## 2020-07-30 MED ORDER — ONDANSETRON HCL 4 MG PO TABS: 4.0000 mg | ORAL_TABLET | Freq: Three times a day (TID) | ORAL | Status: DC | PRN

## 2020-07-30 NOTE — ED Notes (Signed)
TTS in progress 

## 2020-07-30 NOTE — ED Notes (Signed)
Pt screaming and yelling at bedside at officer and staff. Pt screaming "fuck you, you cunt. All you bitches take me out of my house against my will"

## 2020-07-30 NOTE — ED Notes (Signed)
Pt given water to drink. Pt states " I am just sitting here and I am so anxious." Pt offered prn medication to help ease anxiety. Pt declined at this time.

## 2020-07-30 NOTE — Discharge Instructions (Signed)
Substance Abuse Treatment Programs ° °Intensive Outpatient Programs °[facility]     °[address]      °[place], [place]                   °[phone]      ° °[facility] °[address] °[place], [place] °[phone] ° °[facility]     °(Inpatient and outpatient)     °[address]           °[phone]   ° °[facility] °336-288-1484 (Suboxone and Methadone) ° °[address]      °[place], [place] [address]      °336-882-2125      ° °[address] °[place], [place] °852-3033 ° °[facility] (Outpatient/Inpatient, Chemical)    °(insurance only) [phone]      °       °[facility] (Groups & Residential) °[place], [place] °336-389-1413 ° °   °[facility]     °[address]     °[place], [place]      °[phone]      ° °[facility] (for caregivers and family) °[address] °[place], [place] °336-299-4655 ° ° ° ° ° °Residential Treatment Programs °[facility]      °[address], [place], [place] [address]  °([phone]) [phone]      ° °[facility] °[address] [place], [place] [address] °[phone] ° °[facility]        °336-248-8914      ° °[facility] °1-800-659-3381 ° °[facility]             °[address]                                         °[place], [place]                                                °877-615-2722 or 336-784-9470                              ° °[facility] [place] °[address] °[place], [address] °1.877.941.8954 ° °[facility]    °[address]      °[place], [place]     °336-273-5306      ° °[facility] °[address] °, [place] °336-285-9073 ° °[facility]   °[address]     °[place], [place] [address]     °336-899-1550      °Admissions: 8am-3pm M-F ° °[facility] °[address] °[place],  [place] °336-227-7417 ° °[facility]: Residential Program (90 Days)   °[place], [place]      °336-725-8389 or 800-758-6077    ° °[facility] °[place], [place] °(Walk in Hours over the weekend or by referral) ° °[facility] °[address], [place],  [address] °(336) [phone] ° °Crisis Mobile: [facility]:  1-877-626-1772 (for crisis response 24 hours a day) °[facility] Hotline:      1-800-256-2452 °Outpatient Psychiatry and Counseling ° °[facility]: Mobile Crisis   Management 24 hours:  1-877-626-1772 ° °[facility] sliding scale fee and walk in schedule: M-F 8am-12pm/1pm-3pm °[address]  °[place], [place] [address] °[phone] ° °[facility] °[address] °[place], [place] [address] °336-703-9650 ° °[facility] ([facility])- new patient walk-in appointments available Monday - Friday 8am -3pm.          °[address] °[place], [place] [address] °336-676-6840 or crisis line- 336-676-6905 ° °[facility]/ Intensive Outpatient Therapy Program °[address] °[place], [place] [address] °336-832-9804 ° °[facility]                  °Crisis Services      °336.641.4993      °[address]     °[place], [place] [address]                ° °[facility]   °[facility] °[phone] °[address] °[place], [place] [address] ° ° °[facility]          °[address]  °[place], [place] [address]       °(336) [phone] ° °[facility] °[address] °[place], [place] [address] °336-292-1510 ° °[facility]    °[address]    °[place], [place] [address]     °336-632-3505      ° °Parish McKinney, MD     °[address]     °[place] [address]     °336-282-1251     °  °[facility] °[address] °[place] [address] ° °[facility]     °[address]     °[place], [place]      °336-542-2076      ° °[facility] °Shamsher Ahluwalia, MD °[address] °[place], [place] [address] °336-420-9558 ° °[facility]     °[address]     °[place], [place] [address]     °336-274-1237      ° °[facility] °[address] °[place], [place] [address] °336-854-4450 °Resources for Temporary Residential Assistance/Crisis Centers ° °DAY CENTERS °[facility] °M-F 8am-3pm   °[address] [place], [place] [address]   336-332-0824 °Services include: laundry, barbering, support groups, case management, phone  & computer access, showers, AA/NA mtgs, mental health/substance abuse nurse, job skills class, disability information, VA assistance, spiritual classes, etc.  ° °HOMELESS SHELTERS ° °[facility]     °[facility]   °[address], [place]     °336.271.5959       °       °[facility] (women and children)       °[address] °[place], [place] [address] °336-275-0820 °[email or web address] for application and process °Application Required ° °[facility]   °[address]    °[place] [address]     °336.886.4922       °             °[facility] °[address] °, [place] [address] °336.273.5572 °336-235-0363(schedule application appt.) °Application Required ° °[facility] (women only)    °[address]     °[place],  [address]     °336-884-1039      °  Intake starts 6pm daily °Need valid ID, SSC, & Police report °[facility] [place] °[address] °[place], [place] °336-881-5420 °Application Required ° °[facility] (men only)     °[address]      °[place], [place]     °[phone]      ° °[facility] °(Pregnant women only) °[address] °[place], [place] °336-275-0206 ° °[facility]      °[address]      °[place], [place] [address]     °336-722-9951      °       °[facility] °[address] °[place], [place] °336-723-1848 °90 day commitment/SA/Application process ° °[facility](men only)     °[address]     °[place], [place]     °[phone]       °Check-in at 7pm     °       °[facility] °[address] °[place], [place] [address] °336-248-6684 °Men/Women/Women and Children must be there by 7 pm ° °[facility] °[place], [place] °336-722-8721                ° °

## 2020-07-30 NOTE — ED Notes (Signed)
Pt placed in purple scrubs and wanded. Shirt, pants and bra secured in locker.
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
## 2020-07-30 NOTE — ED Notes (Signed)
Pt cursing at staff and [facility] at bedside, states " I dont know why yall need to know anything", pt instructed to calm down, comfort measures provided,
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
## 2020-07-31 NOTE — ED Notes (Signed)
IVC rescinded. Per EDP, pt able to leave at 3am if she chooses.

## 2020-07-31 NOTE — ED Notes (Signed)
Pt refusing urine specimen.

## 2020-07-31 NOTE — ED Notes (Signed)
Pt given discharge papers and personal belongings. Pt refused to sign discharge papers. Pt walked out of ED in purple scrubs.

## 2020-07-31 NOTE — ED Provider Notes (Signed)
Patient was left and a change of shift she could be discharged home at 3 AM at which point it was felt she would be sober.  Nurses report patient has been ambulatory to the bathroom without assistance.  I have seen patient in the hall and she appears to be in no distress.   Devoria Albe, MD 07/31/20 [phone]

## 2020-08-23 ENCOUNTER — Other Ambulatory Visit: Payer: Self-pay | Admitting: Family Medicine

## 2020-08-23 DIAGNOSIS — K2970 Gastritis, unspecified, without bleeding: Secondary | ICD-10-CM

## 2020-11-22 IMAGING — CT CT ABDOMEN AND PELVIS WITH CONTRAST
2 of 4 series · 16 of 46 positions shown, 18 images · IV contrast (OMNIPAQUE)
Comparison: None.

CLINICAL DATA: Acute abdominal pain. Nausea and vomiting.

EXAM:
CT ABDOMEN AND PELVIS WITH CONTRAST
TECHNIQUE: Multidetector CT imaging of the abdomen and pelvis was performed
using the standard protocol following bolus administration of
intravenous contrast.
CONTRAST:  100mL OMNIPAQUE IOHEXOL 300 MG/ML  SOLN

[Series 2: axial st · axial · 0.68mm/px · z∈[-172,+188]mm · 13 of 81 slices shown, 15 images]
[im 5/81  soft-tissue]
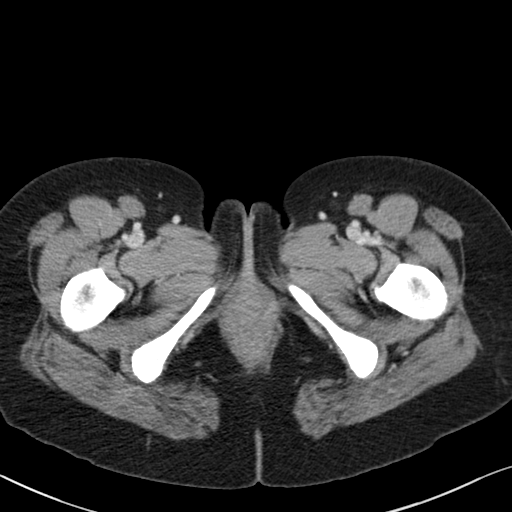
[im 5/81  bone]
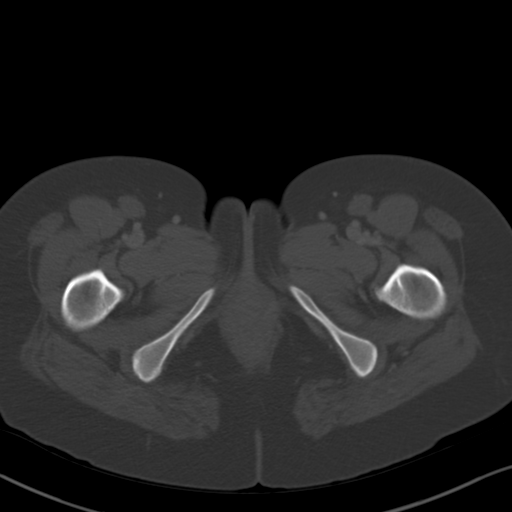
[im 13/81  soft-tissue]
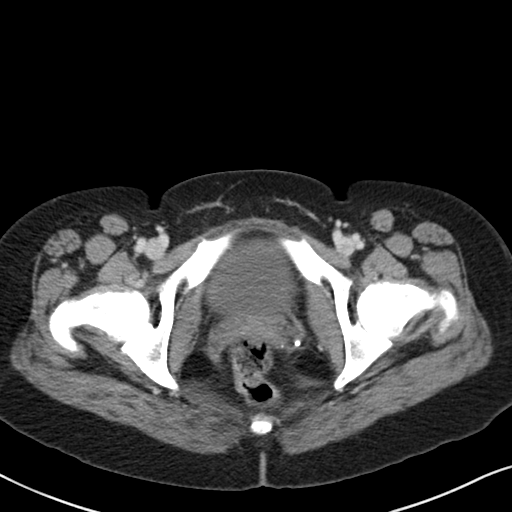
[im 17/81  soft-tissue]
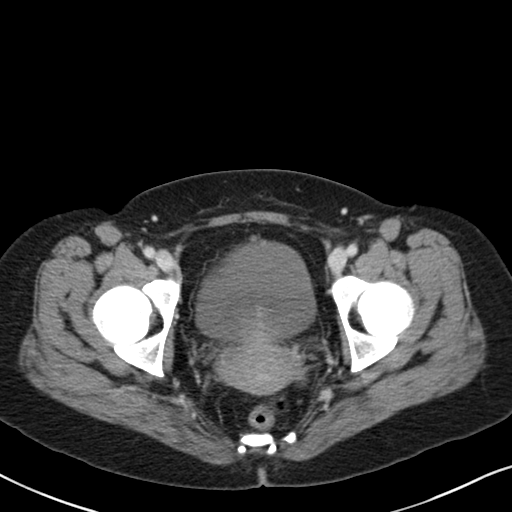
[im 25/81  soft-tissue]
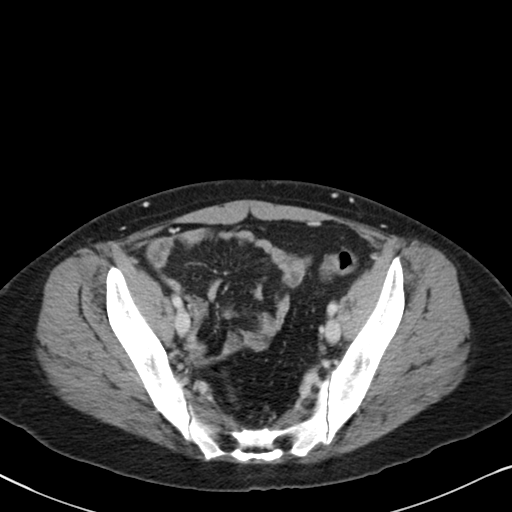
[im 29/81  soft-tissue]
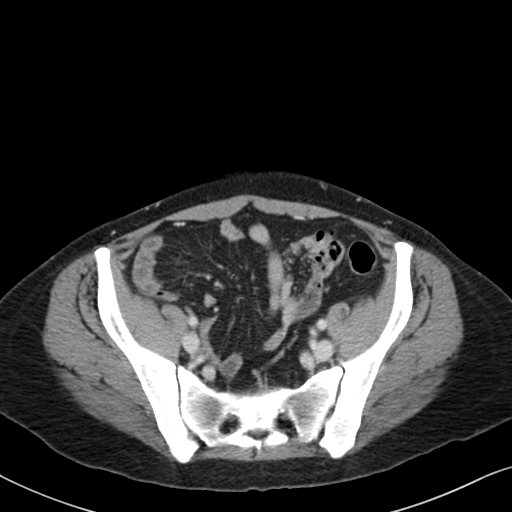
[im 37/81  soft-tissue]
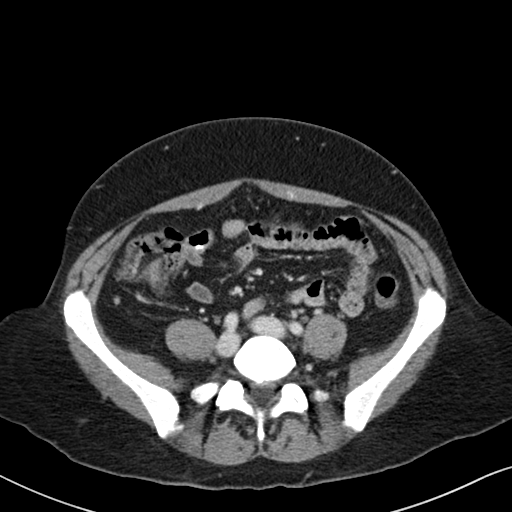
[im 41/81  soft-tissue]
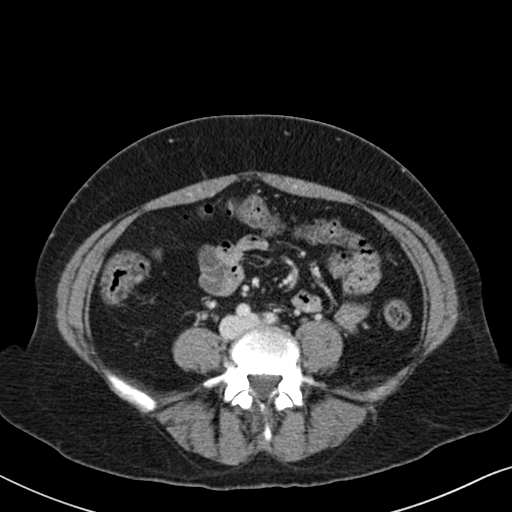
[im 45/81  soft-tissue]
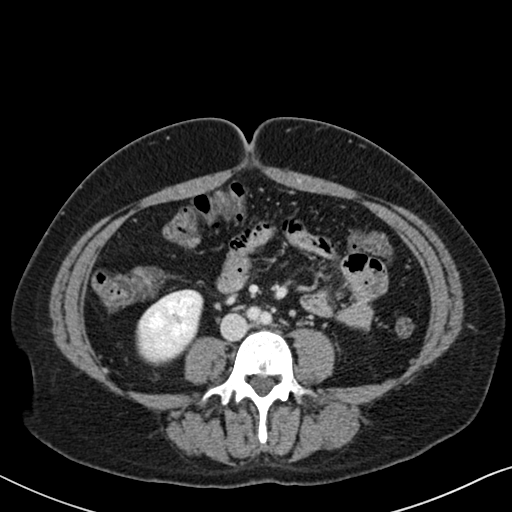
[im 53/81  soft-tissue]
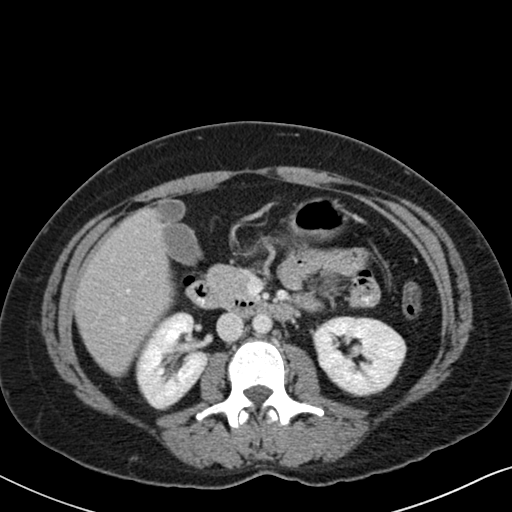
[im 53/81  bone]
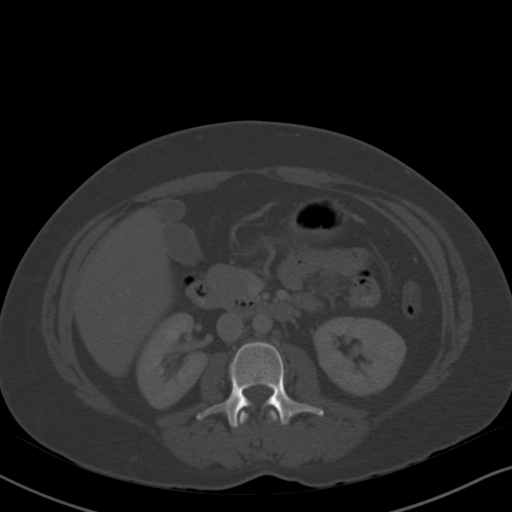
[im 57/81  soft-tissue]
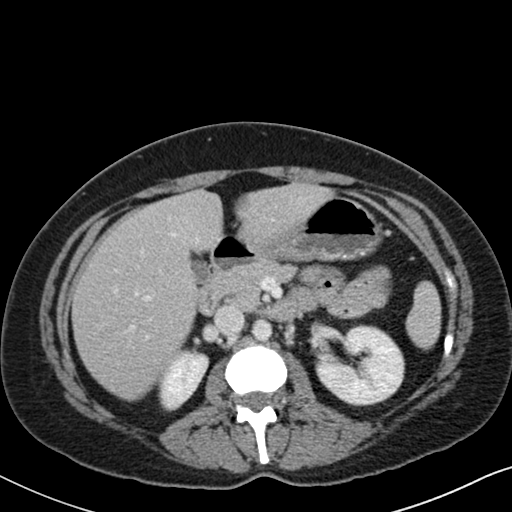
[im 65/81  soft-tissue]
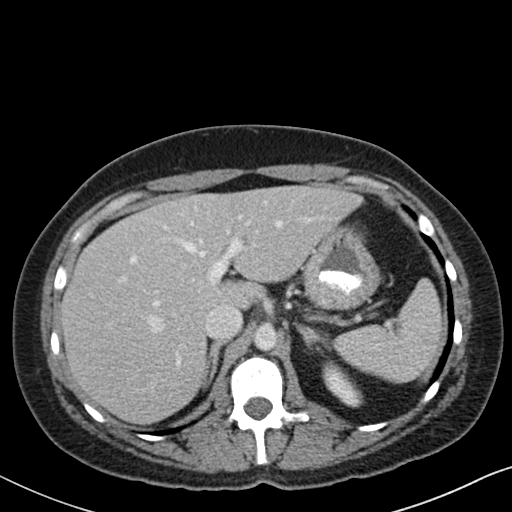
[im 69/81  soft-tissue]
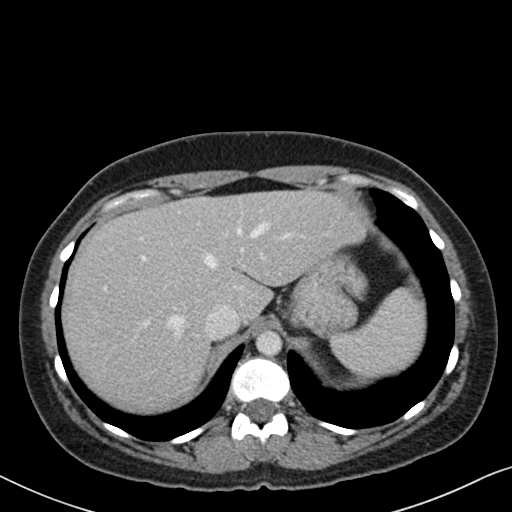
[im 77/81  soft-tissue]
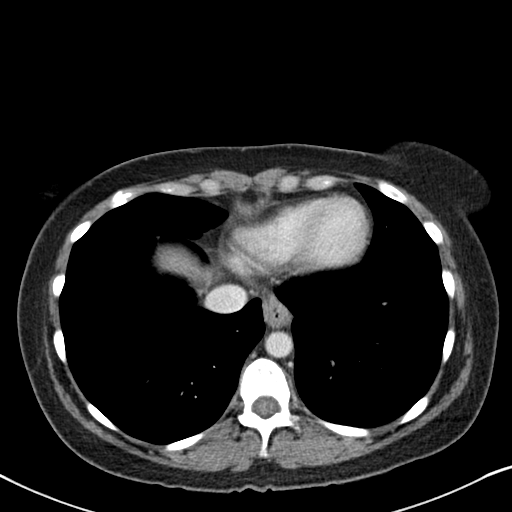

[Series 5: coronal st · coronal · 0.60mm/px · 3 of 119 slices shown]
[im 40/119  soft-tissue]
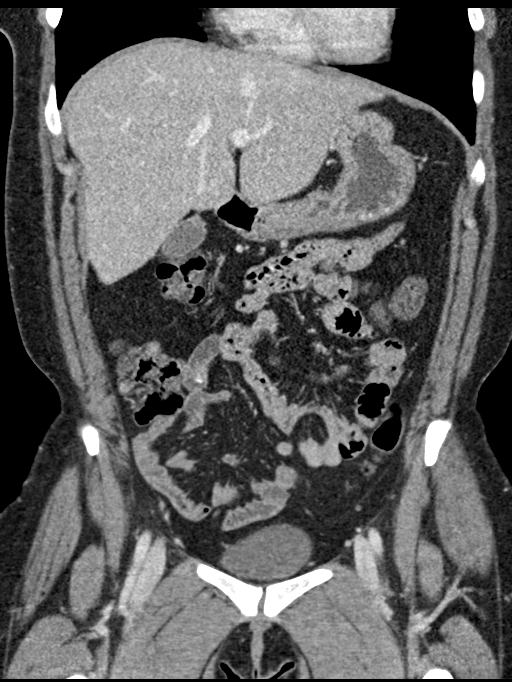
[im 53/119  soft-tissue]
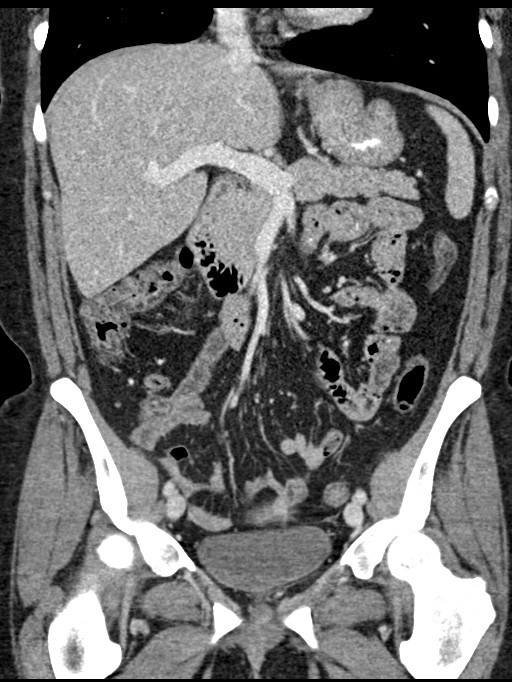
[im 66/119  soft-tissue]
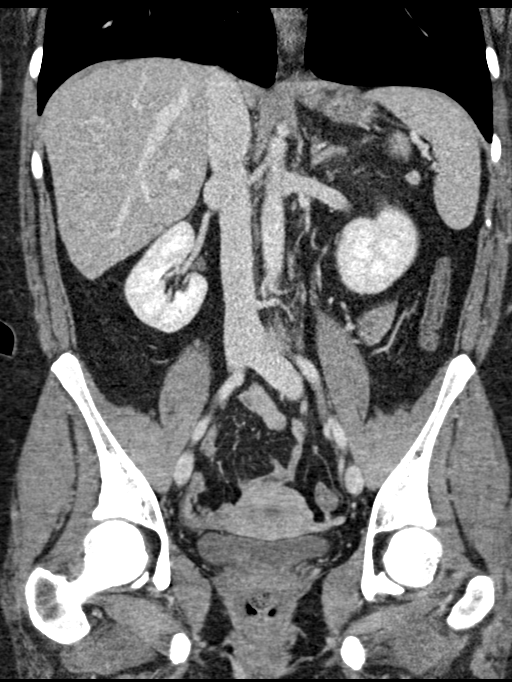

[16 of 46 positions shown; findings below may reference images not displayed]

FINDINGS: Lower chest: Lung bases are clear.

Hepatobiliary: Focal fatty infiltration adjacent to the falciform
ligament. No suspicious lesion. Gallbladder physiologically
distended, no calcified stone. No biliary dilatation.

Pancreas: No ductal dilatation or inflammation.

Spleen: Tiny subcentimeter hypodensities in the medial spleen are
too small to characterize. Normal in size. Small splenule
inferiorly.

Adrenals/Urinary Tract: Normal adrenal glands. No hydronephrosis or
perinephric edema. Homogeneous renal enhancement. Urinary bladder is
physiologically distended without wall thickening.

Stomach/Bowel: Small hiatal hernia. Stomach is nondistended. Small
bowel dilatation, inflammation, or obstruction. Normal appendix.
Submucosal fatty infiltration throughout the colon suggesting prior
or chronic inflammation. No wall thickening or acute inflammatory
change.

Vascular/Lymphatic: No significant vascular findings are present. No
enlarged abdominal or pelvic lymph nodes.

Reproductive: Uterus and bilateral adnexa are unremarkable.

Other: No free air, free fluid, or intra-abdominal fluid collection.
Tiny fat containing umbilical hernia.

Musculoskeletal: There are no acute or suspicious osseous
abnormalities. Scattered bone islands in the pelvis.
IMPRESSION: No acute abnormality or explanation for abdominal pain.

## 2020-12-18 ENCOUNTER — Encounter: Payer: Self-pay | Admitting: Family Medicine

## 2020-12-18 ENCOUNTER — Telehealth (INDEPENDENT_AMBULATORY_CARE_PROVIDER_SITE_OTHER): Payer: Self-pay | Admitting: Family Medicine

## 2020-12-18 ENCOUNTER — Telehealth: Payer: Self-pay

## 2020-12-18 ENCOUNTER — Telehealth: Payer: Self-pay | Admitting: Family Medicine

## 2020-12-18 VITALS — Wt 125.0 lb

## 2020-12-18 DIAGNOSIS — R059 Cough, unspecified: Secondary | ICD-10-CM

## 2020-12-18 DIAGNOSIS — R0981 Nasal congestion: Secondary | ICD-10-CM

## 2020-12-18 MED ORDER — BENZONATATE 100 MG PO CAPS: 100.0000 mg | ORAL_CAPSULE | Freq: Two times a day (BID) | ORAL | 0 refills | Status: DC | PRN

## 2020-12-18 NOTE — Telephone Encounter (Signed)
Spoke with the pt and informed her a virtual visit is needed for evaluation as the last visit was in January 2021.  Appt scheduled for today with Dr Salomon Fick at 3pm.

## 2020-12-18 NOTE — Telephone Encounter (Addendum)
Patient is calling and requesting a refill for Zofran and [facility]alon [facility] [address]. Offered patient an appointment but patient wanted to see if provider would send the medications first and didn't see medications on list. CB is [phone]

## 2020-12-18 NOTE — Progress Notes (Signed)
Virtual Visit via Video Note Patient 's video froze towards the end of the visit.  This provider attempted to contact patient via phone however she did not answer.  VM left. I connected with Pranika Finks on 12/18/20 at  3:00 PM EST by a video enabled telemedicine application 2/2 COVID-19 pandemic and verified that I am speaking with the correct person using two identifiers.  Location patient: home Location provider:work or home office Persons participating in the virtual visit: patient, provider  I discussed the limitations of evaluation and management by telemedicine and the availability of in person appointments. The patient expressed understanding and agreed to proceed.   HPI: Pt is a 40 yo female with pmh sig for allergies, anxiety, asthma, depression who is followed by Dr. Hassan Rowan and seen for acute concern.  Pt with nasal congestion.  Feels like it is moving into her chest.  Symptoms worse in the am.  Coughing causing throat irritation.  Was going to start mucinex.  Has several jobs, one of which is in [employer] where she may have come in contact with a covid positive person.  Has a COVID test scheduled for tomorrow (12/19/20).  States in the past Kimberlee Nearing has helped.   ROS: See pertinent positives and negatives per HPI.  Past Medical History:  Diagnosis Date  . Allergy   . Anxiety   . Asthma    states only when stressed out  . Depression   . History of bronchitis    in college  . Neck pain    ruptured disc in neck  . URI (upper respiratory infection)    early Nov 2014  . Weakness    numbness in left arm    Past Surgical History:  Procedure Laterality Date  . WISDOM TOOTH EXTRACTION      Family History  Problem Relation Age of Onset  . Heart disease Mother   . Hypertension Mother   . Heart attack Mother 22  . Early death Father   . Cancer Father 70       stomach cancer  . Stroke Father   . Heart attack Father        After stroke  . Breast cancer Paternal  Grandmother 59     Current Outpatient Medications:  .  ALBUTEROL IN, Inhale into the lungs., Disp: , Rfl:  .  clobetasol cream (TEMOVATE) 0.05 %, Apply 1 application topically 2 (two) times daily., Disp: 30 g, Rfl: 0 .  FLUoxetine (PROZAC) 20 MG capsule, Take 60 mg by mouth every morning., Disp: , Rfl:  .  loratadine (CLARITIN) 10 MG tablet, Take 10 mg by mouth daily as needed for allergies. , Disp: , Rfl:  .  LORazepam (ATIVAN) 0.5 MG tablet, Take 1 tablet (0.5 mg total) by mouth 3 (three) times daily as needed for anxiety. FROM PSYCHIATRY, Disp: 30 tablet, Rfl:  .  omeprazole (PRILOSEC) 40 MG capsule, TAKE 1 CAPSULE BY MOUTH EVERY DAY, Disp: 30 capsule, Rfl: 2 .  potassium chloride (MICRO-K) 10 MEQ CR capsule, Take 2 capsules (20 mEq total) by mouth 2 (two) times daily., Disp: 7 capsule, Rfl: 0  EXAM:  VITALS per patient if applicable: RR between 12-20 bpm  GENERAL: alert, oriented, appears well and in no acute distress  HEENT: atraumatic, conjunctiva clear, no obvious abnormalities on inspection of external nose and ears  NECK: normal movements of the head and neck  LUNGS: on inspection no signs of respiratory distress, breathing rate appears normal, no obvious gross  SOB, gasping or wheezing  CV: no obvious cyanosis  MS: moves all visible extremities without noticeable abnormality  PSYCH/NEURO: pleasant and cooperative, no obvious depression or anxiety, speech and thought processing grossly intact  ASSESSMENT AND PLAN:  Discussed the following assessment and plan:  Nasal congestion -Continue supportive care including Mucinex, consider Flonase nasal spray on a saline nasal rinse -Patient encouraged to keep appointment for COVID testing tomorrow (12/19/2020) -Given precautions for worsening or continued symptoms  Cough  - Plan: benzonatate (TESSALON) 100 MG capsule  Follow-up as needed   I discussed the assessment and treatment plan with the patient. The patient was  provided an opportunity to ask questions and all were answered. The patient agreed with the plan and demonstrated an understanding of the instructions.   The patient was advised to call back or seek an in-person evaluation if the symptoms worsen or if the condition fails to improve as anticipated.  Deeann Saint, MD

## 2020-12-18 NOTE — Telephone Encounter (Signed)
Pt request routed to PCP for advise

## 2020-12-19 ENCOUNTER — Other Ambulatory Visit: Payer: Self-pay | Admitting: Family Medicine

## 2020-12-19 ENCOUNTER — Other Ambulatory Visit: Payer: Self-pay

## 2020-12-19 DIAGNOSIS — Z20822 Contact with and (suspected) exposure to covid-19: Secondary | ICD-10-CM

## 2020-12-19 MED ORDER — ONDANSETRON 4 MG PO TBDP: 4.0000 mg | ORAL_TABLET | Freq: Three times a day (TID) | ORAL | 0 refills | Status: DC | PRN

## 2020-12-19 MED ORDER — ONDANSETRON HCL 4 MG PO TABS: 4.0000 mg | ORAL_TABLET | Freq: Three times a day (TID) | ORAL | 0 refills | Status: DC | PRN

## 2020-12-19 NOTE — Telephone Encounter (Signed)
Patient informed of the message below and an appt was scheduled on 3/9 for a physical exam.

## 2020-12-19 NOTE — Telephone Encounter (Signed)
Refill sent for her. She is due for physical with me if she wants to set this up.

## 2020-12-23 LAB — NOVEL CORONAVIRUS, NAA: SARS-CoV-2, NAA: NOT DETECTED

## 2021-02-01 ENCOUNTER — Other Ambulatory Visit: Payer: Self-pay | Admitting: Family Medicine

## 2021-02-01 DIAGNOSIS — K2970 Gastritis, unspecified, without bleeding: Secondary | ICD-10-CM

## 2021-02-05 ENCOUNTER — Telehealth: Payer: Self-pay | Admitting: Family Medicine

## 2021-02-05 NOTE — Telephone Encounter (Signed)
  ondansetron (ZOFRAN ODT) 4 MG disintegrating tablet  [id number] [place], [place] - [address] Phone:  [phone]  Fax:  [phone]

## 2021-02-06 ENCOUNTER — Other Ambulatory Visit: Payer: Self-pay | Admitting: Family Medicine

## 2021-02-06 MED ORDER — ONDANSETRON 4 MG PO TBDP: 4.0000 mg | ORAL_TABLET | Freq: Three times a day (TID) | ORAL | 0 refills | Status: DC | PRN

## 2021-02-06 NOTE — Telephone Encounter (Signed)
done

## 2021-02-11 ENCOUNTER — Encounter: Payer: Self-pay | Admitting: Family Medicine

## 2021-04-06 ENCOUNTER — Telehealth: Payer: Self-pay | Admitting: Family Medicine

## 2021-04-06 NOTE — Telephone Encounter (Signed)
Rx denial sent. 

## 2021-04-06 NOTE — Telephone Encounter (Signed)
ondansetron (ZOFRAN ODT) 4 MG disintegrating tablet  [id number] [place], [place] - [address] Phone:  [phone]  Fax:  [phone]

## 2021-04-07 NOTE — Telephone Encounter (Signed)
Informed patient that medication refill request for ondansetron was denied because she has not been seen in over a year. Pt has an appointment scheduled for 05/15/2021 and wanted to see if provider could refill her albuterol inhaler. Pt is asking for a call back, please advise. CB is [phone]. [facility] uses  [id number] [place], [place] [address] [place] [address]  Phone:  [phone] Fax:  [phone]

## 2021-04-07 NOTE — Addendum Note (Signed)
Addended by: Johnella Moloney on: 04/07/2021 02:13 PM   Modules accepted: Orders

## 2021-04-07 NOTE — Telephone Encounter (Signed)
Rx denial sent to the pharmacy with note attached stating an appt is needed since her last visit was over 1 yr ago.

## 2021-05-12 ENCOUNTER — Telehealth: Payer: Self-pay | Admitting: Family Medicine

## 2021-05-12 NOTE — Telephone Encounter (Signed)
FYI:  Pt called in very upset stating that she is not needing the services of [facility] due to not getting an appointment when she needed one back a month ago.  Pt state that she has been advised by her psychiatrist to not do anything or say anything.  She stated that she has not left her house in 1 1/2 months and has not made any appointment with [place] and to please discontinue from calling her. Pts appointment has been cancelled b/c pt stated that she does not need it unable to leave the house and she will consult with her psychiatrist. Pt disconnected the call without any further comments.

## 2021-05-13 NOTE — Telephone Encounter (Signed)
Noted. Sending to office manager as Lorain Childes.

## 2021-05-15 ENCOUNTER — Encounter: Payer: Self-pay | Admitting: Family Medicine

## 2021-05-20 ENCOUNTER — Telehealth: Payer: Self-pay | Admitting: Family Medicine

## 2021-05-20 NOTE — Telephone Encounter (Signed)
Pt call and stated she need a refill on  ondansetron (ZOFRAN ODT) 4 MG disintegrating tablet and  ALBUTEROL IN [facility] to  [id number] [place], [place] - [address] Phone:  [phone]  Fax:  [phone]    Pt want a call back went sent.

## 2021-05-20 NOTE — Telephone Encounter (Signed)
She hasn't been seen by me in over a year. She needs visit for med refill. Last phone call stated she was not making future visits and did not want [place] to call her in the future? So not sure if she is planning on staying patient in practice.

## 2021-05-21 NOTE — Telephone Encounter (Signed)
Left a detailed message with the information below at the pts cell number. 

## 2021-06-06 ENCOUNTER — Encounter (HOSPITAL_COMMUNITY): Payer: Self-pay | Admitting: *Deleted

## 2021-06-06 ENCOUNTER — Other Ambulatory Visit: Payer: Self-pay

## 2021-06-06 ENCOUNTER — Emergency Department (HOSPITAL_COMMUNITY)
Admission: EM | Admit: 2021-06-06 | Discharge: 2021-06-07 | Disposition: A | Discharge status: Home / Self Care | Payer: 59 | Attending: Emergency Medicine | Admitting: Emergency Medicine

## 2021-06-06 DIAGNOSIS — R112 Nausea with vomiting, unspecified: Secondary | ICD-10-CM | POA: Insufficient documentation

## 2021-06-06 DIAGNOSIS — R1110 Vomiting, unspecified: Secondary | ICD-10-CM | POA: Diagnosis present

## 2021-06-06 DIAGNOSIS — D72829 Elevated white blood cell count, unspecified: Secondary | ICD-10-CM | POA: Insufficient documentation

## 2021-06-06 DIAGNOSIS — E876 Hypokalemia: Secondary | ICD-10-CM | POA: Diagnosis not present

## 2021-06-06 DIAGNOSIS — F17210 Nicotine dependence, cigarettes, uncomplicated: Secondary | ICD-10-CM | POA: Insufficient documentation

## 2021-06-06 DIAGNOSIS — R531 Weakness: Secondary | ICD-10-CM | POA: Insufficient documentation

## 2021-06-06 DIAGNOSIS — J45909 Unspecified asthma, uncomplicated: Secondary | ICD-10-CM | POA: Diagnosis not present

## 2021-06-06 DIAGNOSIS — R739 Hyperglycemia, unspecified: Secondary | ICD-10-CM | POA: Diagnosis not present

## 2021-06-06 LAB — CBC WITH DIFFERENTIAL/PLATELET
Basophils Absolute: 0 10*3/uL (ref 0.0–0.1)
Basophils Relative: 0 %
Eosinophils Absolute: 0 10*3/uL (ref 0.0–0.5)
Eosinophils Relative: 0 %
HCT: 42.8 % (ref 36.0–46.0)
Hemoglobin: 14.8 g/dL (ref 12.0–15.0)
Lymphocytes Relative: 2 %
Lymphs Abs: 0.4 10*3/uL — ABNORMAL LOW (ref 0.7–4.0)
MCH: 33.8 pg (ref 26.0–34.0)
MCHC: 34.6 g/dL (ref 30.0–36.0)
MCV: 97.7 fL (ref 80.0–100.0)
Monocytes Absolute: 0.7 10*3/uL (ref 0.1–1.0)
Monocytes Relative: 4 %
Neutro Abs: 17 10*3/uL — ABNORMAL HIGH (ref 1.7–7.7)
Neutrophils Relative %: 94 %
Platelets: 164 10*3/uL (ref 150–400)
RBC: 4.38 MIL/uL (ref 3.87–5.11)
RDW: 13.6 % (ref 11.5–15.5)
WBC: 18.1 10*3/uL — ABNORMAL HIGH (ref 4.0–10.5)
nRBC: 0 % (ref 0.0–0.2)

## 2021-06-06 LAB — PREGNANCY, URINE: Preg Test, Ur: NEGATIVE

## 2021-06-06 LAB — URINALYSIS, ROUTINE W REFLEX MICROSCOPIC
Bilirubin Urine: NEGATIVE
Glucose, UA: 150 mg/dL — AB
Ketones, ur: NEGATIVE mg/dL
Nitrite: NEGATIVE
Protein, ur: 30 mg/dL — AB
Specific Gravity, Urine: 1.004 — ABNORMAL LOW (ref 1.005–1.030)
WBC, UA: >50 WBC/hpf — ABNORMAL HIGH (ref 0–5)
pH: 8 (ref 5.0–8.0)

## 2021-06-06 LAB — COMPREHENSIVE METABOLIC PANEL
ALT: 35 U/L (ref 0–44)
AST: 27 U/L (ref 15–41)
Albumin: 3.7 g/dL (ref 3.5–5.0)
Alkaline Phosphatase: 71 U/L (ref 38–126)
Anion gap: 13 (ref 5–15)
BUN: 5 mg/dL — ABNORMAL LOW (ref 6–20)
CO2: 25 mmol/L (ref 22–32)
Calcium: 8.9 mg/dL (ref 8.9–10.3)
Chloride: 93 mmol/L — ABNORMAL LOW (ref 98–111)
Creatinine, Ser: 0.69 mg/dL (ref 0.44–1.00)
GFR, Estimated: >60 mL/min (ref >60)
Glucose, Bld: 127 mg/dL — ABNORMAL HIGH (ref 70–99)
Potassium: 2.6 mmol/L — CL (ref 3.5–5.1)
Sodium: 131 mmol/L — ABNORMAL LOW (ref 135–145)
Total Bilirubin: 0.5 mg/dL (ref 0.3–1.2)
Total Protein: 8.1 g/dL (ref 6.5–8.1)

## 2021-06-06 LAB — RAPID URINE DRUG SCREEN, HOSP PERFORMED
Amphetamines: NOT DETECTED
Barbiturates: NOT DETECTED
Benzodiazepines: NOT DETECTED
Cocaine: NOT DETECTED
Opiates: NOT DETECTED
Tetrahydrocannabinol: POSITIVE — AB

## 2021-06-06 LAB — MAGNESIUM: Magnesium: 1.8 mg/dL (ref 1.7–2.4)

## 2021-06-06 LAB — LIPASE, BLOOD: Lipase: 23 U/L (ref 11–51)

## 2021-06-06 MED ORDER — POTASSIUM CHLORIDE 10 MEQ/100ML IV SOLN
10.0000 meq | INTRAVENOUS | Status: AC
  Administered 2021-06-06 (×3): 10 meq via INTRAVENOUS
  Filled 2021-06-06 (×3): qty 100

## 2021-06-06 MED ORDER — POTASSIUM CHLORIDE CRYS ER 20 MEQ PO TBCR: 20.0000 meq | EXTENDED_RELEASE_TABLET | Freq: Two times a day (BID) | ORAL | 0 refills | Status: DC

## 2021-06-06 MED ORDER — SODIUM CHLORIDE 0.9 % IV SOLN
25.0000 mg | Freq: Four times a day (QID) | INTRAVENOUS | Status: DC | PRN
  Administered 2021-06-06: 22:00:00 25 mg via INTRAVENOUS
  Filled 2021-06-06: qty 1

## 2021-06-06 MED ORDER — ONDANSETRON HCL 4 MG/2ML IJ SOLN
4.0000 mg | Freq: Once | INTRAMUSCULAR | Status: AC
  Administered 2021-06-06: 20:00:00 4 mg via INTRAVENOUS
  Filled 2021-06-06: qty 2

## 2021-06-06 MED ORDER — ALUM & MAG HYDROXIDE-SIMETH 200-200-20 MG/5ML PO SUSP
30.0000 mL | Freq: Once | ORAL | Status: DC
  Filled 2021-06-06: qty 30

## 2021-06-06 MED ORDER — PROMETHAZINE HCL 25 MG RE SUPP: 25.0000 mg | Freq: Three times a day (TID) | RECTAL | 0 refills | Status: DC | PRN

## 2021-06-06 MED ORDER — SODIUM CHLORIDE 0.9 % IV BOLUS
1000.0000 mL | Freq: Once | INTRAVENOUS | Status: AC
  Administered 2021-06-06: 21:00:00 1000 mL via INTRAVENOUS

## 2021-06-06 MED ORDER — CEPHALEXIN 500 MG PO CAPS: 500.0000 mg | ORAL_CAPSULE | Freq: Two times a day (BID) | ORAL | 0 refills | Status: AC

## 2021-06-06 MED ORDER — PROMETHAZINE HCL 25 MG/ML IJ SOLN
INTRAMUSCULAR | Status: AC
  Filled 2021-06-06: qty 1

## 2021-06-06 MED ORDER — SODIUM CHLORIDE 0.9 % IV BOLUS
1000.0000 mL | Freq: Once | INTRAVENOUS | Status: AC
  Administered 2021-06-06: 20:00:00 1000 mL via INTRAVENOUS

## 2021-06-06 MED ORDER — LIDOCAINE HCL (PF) 1 % IJ SOLN
INTRAMUSCULAR | Status: AC
  Administered 2021-06-06: 22:00:00 30 mL
  Filled 2021-06-06: qty 30

## 2021-06-06 MED ORDER — CEFTRIAXONE SODIUM 1 G IJ SOLR
1.0000 g | Freq: Once | INTRAMUSCULAR | Status: AC
Start: 2021-06-06 — End: 2021-06-06
  Administered 2021-06-06: 22:00:00 1 g via INTRAMUSCULAR
  Filled 2021-06-06: qty 10

## 2021-06-06 MED ORDER — LIDOCAINE VISCOUS HCL 2 % MT SOLN
15.0000 mL | Freq: Once | OROMUCOSAL | Status: DC
  Filled 2021-06-06: qty 15

## 2021-06-06 MED ORDER — POTASSIUM CHLORIDE CRYS ER 20 MEQ PO TBCR
40.0000 meq | EXTENDED_RELEASE_TABLET | Freq: Once | ORAL | Status: AC
  Administered 2021-06-07: 40 meq via ORAL
  Filled 2021-06-06: qty 2

## 2021-06-06 NOTE — ED Provider Notes (Addendum)
[facility] EMERGENCY DEPARTMENT Provider Note   CSN: [id number] Arrival date & time: 06/06/21  1826     History Chief Complaint  Patient presents with   Emesis    Meagan Mason is a 40 y.o. female.  HPI  Patient with significant medical history of anxiety, asthma, cyclic vomiting presents with chief complaint of vomiting.  Patient states this started on Wednesday morning, states it normally comes on when she is stressed out(has a lot going on in her life since Tuesday) she has been unable to tolerate p.o., she denies hematemesis, coffee-ground emesis, denies stomach pain, urinary symptoms, constipation, diarrhea.  She denies systemic infection like fevers or chills, general body aches.  Patient states in the past she needs medication to help with her nausea and vomiting.  Denies alleviating factors.   Past Medical History:  Diagnosis Date   Allergy    Anxiety    Asthma    states only when stressed out   Depression    History of bronchitis    in college   Neck pain    ruptured disc in neck   URI (upper respiratory infection)    early Nov 2014   Weakness    numbness in left arm    Patient Active Problem List   Diagnosis Date Noted   Anxiety 04/24/2019   Head congestion 10/30/2018   Gastritis 02/06/2016   Tobacco abuse 02/06/2016   Health care maintenance 02/06/2016    Past Surgical History:  Procedure Laterality Date   WISDOM TOOTH EXTRACTION       OB History     Gravida  0   Para  0   Term  0   Preterm  0   AB  0   Living  0      SAB  0   IAB  0   Ectopic  0   Multiple  0   Live Births  0           Family History  Problem Relation Age of Onset   Heart disease Mother    Hypertension Mother    Heart attack Mother 44   Early death Father    Cancer Father 2       stomach cancer   Stroke Father    Heart attack Father        After stroke   Breast cancer Paternal Grandmother 3    Social History   Tobacco Use   Smoking status:  Every Day    Packs/day: 1.00    Years: 10.00    Pack years: 10.00    Types: Cigarettes   Smokeless tobacco: Never  Vaping Use   Vaping Use: Never used  Substance Use Topics   Alcohol use: Yes    Comment: wine 2-3 times a week   Drug use: Yes    Types: Marijuana    Home Medications Prior to Admission medications   Medication Sig Start Date End Date Taking? Authorizing Provider  cephALEXin (KEFLEX) 500 MG capsule Take 1 capsule (500 mg total) by mouth 2 (two) times daily for 7 days. 06/06/21 06/13/21 Yes Carroll Sage, PA-C  potassium chloride SA (KLOR-CON) 20 MEQ tablet Take 1 tablet (20 mEq total) by mouth 2 (two) times daily for 5 days. 06/06/21 06/11/21 Yes Carroll Sage, PA-C  promethazine (PHENERGAN) 25 MG suppository Place 1 suppository (25 mg total) rectally every 8 (eight) hours as needed for nausea or vomiting. 06/06/21  Yes Carroll Sage, PA-C  ALBUTEROL IN Inhale into the lungs.    [provider]  benzonatate (TESSALON) 100 MG capsule Take 1 capsule (100 mg total) by mouth 2 (two) times daily as needed for cough. 12/18/20   Deeann Saint, MD  clobetasol cream (TEMOVATE) 0.05 % Apply 1 application topically 2 (two) times daily. 05/28/20   Jannifer Rodney A, FNP  FLUoxetine (PROZAC) 20 MG capsule Take 60 mg by mouth every morning. 05/08/20   [provider]  loratadine (CLARITIN) 10 MG tablet Take 10 mg by mouth daily as needed for allergies.     [provider]  LORazepam (ATIVAN) 0.5 MG tablet Take 1 tablet (0.5 mg total) by mouth 3 (three) times daily as needed for anxiety. FROM PSYCHIATRY 01/18/20   Wynn Banker, MD  omeprazole (PRILOSEC) 40 MG capsule TAKE 1 CAPSULE BY MOUTH EVERY DAY 02/05/21   Koberlein, Paris Lore, MD  ondansetron (ZOFRAN ODT) 4 MG disintegrating tablet Take 1 tablet (4 mg total) by mouth every 8 (eight) hours as needed for nausea or vomiting. Patient taking differently: Take by mouth every 8 (eight) hours as needed for  nausea or vomiting. 02/06/21   Koberlein, Paris Lore, MD  potassium chloride (MICRO-K) 10 MEQ CR capsule Take 2 capsules (20 mEq total) by mouth 2 (two) times daily. 06/15/20   Liberty Handy, PA-C  CARAFATE 1 GM/10ML suspension Take 10 mLs by mouth 4 (four) times daily. 07/05/19 10/22/19  [provider]  metoCLOPramide (REGLAN) 10 MG tablet Take 1 tablet (10 mg total) by mouth every 6 (six) hours as needed for nausea or vomiting. 07/06/19 10/22/19  Loren Racer, MD    Allergies    Other and Benadryl [diphenhydramine]  Review of Systems   Review of Systems  Constitutional:  Negative for chills and fever.  HENT:  Negative for congestion.   Respiratory:  Negative for shortness of breath.   Cardiovascular:  Negative for chest pain.  Gastrointestinal:  Positive for nausea and vomiting. Negative for abdominal pain and diarrhea.  Genitourinary:  Negative for enuresis.  Musculoskeletal:  Negative for back pain.  Skin:  Negative for rash.  Neurological:  Negative for headaches.  Hematological:  Does not bruise/bleed easily.   Physical Exam Updated Vital Signs BP (!) 161/98   Pulse (!) 134   Temp 99.8 F (37.7 C) (Oral)   Resp 18   LMP 05/16/2021 (Exact Date)   SpO2 97%   Physical Exam Vitals and nursing note reviewed.  Constitutional:      General: She is not in acute distress.    Appearance: She is not ill-appearing.  HENT:     Head: Normocephalic and atraumatic.     Nose: No congestion.  Eyes:     Conjunctiva/sclera: Conjunctivae normal.  Cardiovascular:     Rate and Rhythm: Normal rate and regular rhythm.     Pulses: Normal pulses.     Heart sounds: No murmur heard.   No friction rub. No gallop.  Pulmonary:     Effort: No respiratory distress.     Breath sounds: No wheezing, rhonchi or rales.  Abdominal:     Palpations: Abdomen is soft.     Tenderness: There is no abdominal tenderness. There is no right CVA tenderness or left CVA tenderness.   Musculoskeletal:     Right lower leg: No edema.     Left lower leg: No edema.  Skin:    General: Skin is warm and dry.  Neurological:     Mental  Status: She is alert.  Psychiatric:        Mood and Affect: Mood normal.    ED Results / Procedures / Treatments   Labs (all labs ordered are listed, but only abnormal results are displayed) Labs Reviewed  COMPREHENSIVE METABOLIC PANEL - Abnormal; Notable for the following components:      Result Value   Sodium 131 (*)    Potassium 2.6 (*)    Chloride 93 (*)    Glucose, Bld 127 (*)    BUN 5 (*)    All other components within normal limits  CBC WITH DIFFERENTIAL/PLATELET - Abnormal; Notable for the following components:   WBC 18.1 (*)    Neutro Abs 17.0 (*)    Lymphs Abs 0.4 (*)    All other components within normal limits  URINALYSIS, ROUTINE W REFLEX MICROSCOPIC - Abnormal; Notable for the following components:   APPearance HAZY (*)    Specific Gravity, Urine 1.004 (*)    Glucose, UA 150 (*)    Hgb urine dipstick MODERATE (*)    Protein, ur 30 (*)    Leukocytes,Ua LARGE (*)    WBC, UA >50 (*)    Bacteria, UA RARE (*)    Non Squamous Epithelial 0-5 (*)    All other components within normal limits  RAPID URINE DRUG SCREEN, HOSP PERFORMED - Abnormal; Notable for the following components:   Tetrahydrocannabinol POSITIVE (*)    All other components within normal limits  URINE CULTURE  LIPASE, BLOOD  PREGNANCY, URINE  MAGNESIUM    EKG None  Radiology No results found.  Procedures Procedures   Medications Ordered in ED Medications  alum & mag hydroxide-simeth (MAALOX/MYLANTA) 200-200-20 MG/5ML suspension 30 mL (30 mLs Oral Patient Refused/Not Given 06/06/21 1959)    And  lidocaine (XYLOCAINE) 2 % viscous mouth solution 15 mL (15 mLs Oral Patient Refused/Not Given 06/06/21 1959)  potassium chloride 10 mEq in 100 mL IVPB (10 mEq Intravenous New Bag/Given 06/06/21 2308)  promethazine (PHENERGAN) 25 mg in sodium chloride 0.9  % 50 mL IVPB (25 mg Intravenous New Bag/Given 06/06/21 2200)  potassium chloride SA (KLOR-CON) CR tablet 40 mEq (has no administration in time range)  sodium chloride 0.9 % bolus 1,000 mL (1,000 mLs Intravenous New Bag/Given 06/06/21 1957)  ondansetron (ZOFRAN) injection 4 mg (4 mg Intravenous Given 06/06/21 1954)  sodium chloride 0.9 % bolus 1,000 mL (1,000 mLs Intravenous New Bag/Given 06/06/21 2119)  cefTRIAXone (ROCEPHIN) injection 1 g (1 g Intramuscular Given 06/06/21 2217)  lidocaine (PF) (XYLOCAINE) 1 % injection (30 mLs  Given 06/06/21 2217)    ED Course  I have reviewed the triage vital signs and the nursing notes.  Pertinent labs & imaging results that were available during my care of the patient were reviewed by me and considered in my medical decision making (see chart for details).    MDM Rules/Calculators/A&P                         Initial impression-patient presents with nausea and vomiting.  She is alert, does not appear to be in acute stress, vital signs show tachycardia.  Will provide patient with fluids, antiemetics, GI cocktail and reassess.  Work-up-CBC shows leukocytosis of 18.1, CMP shows hyponatremia 131, hypokalemia 2.6, hyperglycemia 127, lipase 23, UA shows large leukocytes, many white blood cells, rare bacteria.  Urine pregnancy is negative.  Rapid drug screen shows positive tetra cannabinoids  Reassessment-nursing staff notified me that patient was  refusing EKG, explained to the patient that this is necessary to evaluate her QT as she is taking medication that will prolong her QT and if it becomes too long it could send her to her cardiac arrest.  Patient states she still does not want this and is aware of the risks.  Noted that patient has potassium 2.6 will obtain magnesium level start her on IV potassium.  She also has a white count of 18,000 UA concerning for UTI suspect this is her source.  will culture urine and start her on ceftriaxone.   Rule out-low suspicion for  pancreatitis as patient has no epigastric pain, lipase is within normal limits.  Low suspicion for bowel obstruction as abdomen is nondistended, dull to percussion, no peritoneal sign present on my exam, patient still passing flatus.  Low suspicion for kidney stone as patient denies urinary symptoms, she has no CVA tenderness, no red blood cells seen on CBC.  I have low suspicion for pyelonephritis as does not have CVA tenderness, she is nontoxic-appearing, vital signs reassuring.  She does have leukocytosis possible this could be from acute phase reactant from vomiting or from overlying UTI with an abnormal UA.  will provide her with Rocephin at Emergency Department and culture urine  Suspect hypokalemia secondary due to nausea.   Plan-due to shift change patient will handoff to Dr. Wilkie AyeHorton she is provided HPI, current work-up, likely disposition.  Follow-up on patient, if she is tolerating p.o., she can be discharged home, with prescription for potassium, Phenergan, Keflex follow-up with PCP for reevaluation of hypokalemia, UTI  Final Clinical Impression(s) / ED Diagnoses Final diagnoses:  Hypokalemia    Rx / DC Orders ED Discharge Orders          Ordered    potassium chloride SA (KLOR-CON) 20 MEQ tablet  2 times daily        06/06/21 2311    promethazine (PHENERGAN) 25 MG suppository  Every 8 hours PRN        06/06/21 2311    cephALEXin (KEFLEX) 500 MG capsule  2 times daily        06/06/21 2318             Carroll SageFaulkner, Malyn Aytes J, PA-C 06/06/21 2329    Carroll SageFaulkner, Chella Chapdelaine J, PA-C 06/06/21 2331    Shon BatonHorton, Courtney F, MD 06/07/21 [id number]

## 2021-06-06 NOTE — ED Triage Notes (Signed)
Recurring emesis, states she has episodes of vomiting due to stress

## 2021-06-06 NOTE — ED Notes (Signed)
Date and time results received: 06/06/21 2049 (use smartphrase ".now" to insert current time)  Test: Potassium Critical Value: 2.6  Name of Provider Notified: Berle Mull PA  Orders Received? Or Actions Taken?: acknowledged

## 2021-06-06 NOTE — Discharge Instructions (Addendum)
Your potassium was low here today started you on oral potassium please take as prescribed.  Given you Phenergan for nausea please use as needed.  Recommend a bland diet.  UA concerning for possible UTI we will start you on antibiotics.  Please follow-up with your primary care doctor in 1 week's time for repeat check of your potassium levels.  Come back to the emergency department if you develop chest pain, shortness of breath, severe abdominal pain, uncontrolled nausea, vomiting, diarrhea.

## 2021-06-07 NOTE — ED Notes (Signed)
Patient has consumed ice chip and drank approximately 150 of water at this time.

## 2021-06-09 LAB — URINE CULTURE: Culture: >=100000 — AB

## 2021-06-10 ENCOUNTER — Telehealth: Payer: Self-pay

## 2021-06-10 NOTE — Telephone Encounter (Signed)
Post ED Visit - Positive Culture Follow-up  Culture report reviewed by antimicrobial stewardship pharmacist: [facility] [x]  , Pharm.D. []  Loleta Dicker, Pharm.D., BCPS AQ-ID []  , Pharm.D., BCPS []  Celedonio Miyamoto, .D., BCPS []  [place], .D., BCPS, AAHIVP []  Georgina Pillion, Pharm.D., BCPS, AAHIVP []  [address], PharmD, BCPS []  , PharmD, BCPS []  [place], PharmD, BCPS []  [address], PharmD []  , PharmD, BCPS []  Estella Husk, PharmD  Pharmacy Team []  Lysle Pearl, PharmD []  , PharmD []  Phillips Climes, PharmD []  , Rph []  Agapito Games) , PharmD []  Verlan Friends, PharmD []  , PharmD []  Mervyn Gay, PharmD []  , PharmD []  Vinnie Level, PharmD []  [facility], PharmD []  , PharmD []  Len Childs, PharmD   Positive urine culture Treated with Cephalexin, organism sensitive to the same and no further patient follow-up is required at this time.  06/10/2021, 9:45 AM

## 2021-08-11 ENCOUNTER — Encounter: Payer: Self-pay | Admitting: Emergency Medicine

## 2021-08-11 ENCOUNTER — Ambulatory Visit
Admission: EM | Admit: 2021-08-11 | Discharge: 2021-08-11 | Disposition: A | Discharge status: Home / Self Care | Payer: 59 | Attending: Family Medicine | Admitting: Family Medicine

## 2021-08-11 ENCOUNTER — Other Ambulatory Visit: Payer: Self-pay

## 2021-08-11 DIAGNOSIS — S0181XA Laceration without foreign body of other part of head, initial encounter: Secondary | ICD-10-CM | POA: Diagnosis not present

## 2021-08-11 DIAGNOSIS — Z23 Encounter for immunization: Secondary | ICD-10-CM | POA: Diagnosis not present

## 2021-08-11 MED ORDER — TETANUS-DIPHTH-ACELL PERTUSSIS 5-2.5-18.5 LF-MCG/0.5 IM SUSY
0.5000 mL | PREFILLED_SYRINGE | Freq: Once | INTRAMUSCULAR | Status: AC
  Administered 2021-08-11: 0.5 mL via INTRAMUSCULAR

## 2021-08-11 NOTE — ED Provider Notes (Signed)
[facility] CARE CENTER   [id number] 08/11/21 Arrival Time: 0910  ASSESSMENT & PLAN:  1. Chin laceration, initial encounter    Meds ordered this encounter  Medications   Tdap (BOOSTRIX) injection 0.5 mL   Procedure: Verbal consent obtained. Patient provided with risks and alternatives to the procedure. Wound copiously irrigated with NS then cleansed with betadine. Local anesthesia: not required. Wound carefully explored. No foreign body, tendon injury, or nonviable tissue were noted. Using sterile technique, Dermabond applied to wound for closure. Procedure tolerated well. No complications. Minimal bleeding. Advised to look for and return for any signs of infection such as redness, swelling, discharge, or worsening pain.  Reviewed expectations re: course of current medical issues. Questions answered. Outlined signs and symptoms indicating need for more acute intervention. Patient verbalized understanding. After Visit Summary given.   SUBJECTIVE:  Meagan Mason is a 40 y.o. female who presents with a laceration of chin; left; last evening; cleaned; no active bleeding. Normal jaw movement without difficulty.  Td UTD: Unknown.   OBJECTIVE:  Vitals:   08/11/21 0921 08/11/21 0922  BP: (!) 152/96   Pulse: (!) 109   Resp: 18   Temp: 98.4 F (36.9 C)   TempSrc: Oral   SpO2: 99%   Weight:  63.5 kg  Height:  5\' 4"  (1.626 m)     General appearance: alert; no distress Skin: linear laceration just under L chin; size: approx 1.5 cm; clean wound edges, no foreign bodies; without active bleeding Psychological: alert and cooperative; normal mood and affect   Allergies  Allergen Reactions   Other Shortness Of Breath and Itching    Pecans, tree nuts    Benadryl [Diphenhydramine] Other (See Comments)    hyperactivity    Past Medical History:  Diagnosis Date   Allergy    Anxiety    Asthma    states only when stressed out   Depression    History of bronchitis    in college    Neck pain    ruptured disc in neck   URI (upper respiratory infection)    early Nov 2014   Weakness    numbness in left arm   Social History   Socioeconomic History   Marital status: Single    Spouse name: Not on file   Number of children: Not on file   Years of education: Not on file   Highest education level: Not on file  Occupational History   Not on file  Tobacco Use   Smoking status: Every Day    Packs/day: 1.00    Years: 10.00    Pack years: 10.00    Types: Cigarettes   Smokeless tobacco: Never  Vaping Use   Vaping Use: Never used  Substance and Sexual Activity   Alcohol use: Yes    Comment: wine 2-3 times a week   Drug use: Yes    Types: Marijuana   Sexual activity: Yes  Other Topics Concern   Not on file  Social History Narrative   Engaged.   College degree, bachelors. Works in Dec 2014.   Occasional alcohol. Every day smoker. No drugs.   Drink caffeinated beverages.   Wears her seatbelt and bicycle helmet.   Smoke detector at home.   Social Determinants of Health   Financial Resource Strain: Not on file  Food Insecurity: Not on file  Transportation Needs: Not on file  Physical Activity: Not on file  Stress: Not on file  Social Connections: Not on file  Mardella Layman, MD 08/11/21 1032

## 2021-08-11 NOTE — ED Triage Notes (Signed)
Patient fell last night, tripped over a box and cut her chin.  No loss of consciousness.  Patient unsure of last Tdap.

## 2021-08-11 NOTE — Discharge Instructions (Signed)
Meds ordered this encounter  Medications   Tdap (BOOSTRIX) injection 0.5 mL    

## 2021-09-23 ENCOUNTER — Ambulatory Visit (INDEPENDENT_AMBULATORY_CARE_PROVIDER_SITE_OTHER): Payer: 59 | Admitting: Internal Medicine

## 2021-09-23 ENCOUNTER — Other Ambulatory Visit: Payer: Self-pay

## 2021-09-23 ENCOUNTER — Encounter: Payer: Self-pay | Admitting: Internal Medicine

## 2021-09-23 VITALS — BP 162/98 | HR 102 | Temp 99.1°F | Resp 18 | Ht 64.0 in | Wt 136.1 lb

## 2021-09-23 DIAGNOSIS — Z720 Tobacco use: Secondary | ICD-10-CM

## 2021-09-23 DIAGNOSIS — Z114 Encounter for screening for human immunodeficiency virus [HIV]: Secondary | ICD-10-CM

## 2021-09-23 DIAGNOSIS — Z2821 Immunization not carried out because of patient refusal: Secondary | ICD-10-CM

## 2021-09-23 DIAGNOSIS — Z1159 Encounter for screening for other viral diseases: Secondary | ICD-10-CM

## 2021-09-23 DIAGNOSIS — F411 Generalized anxiety disorder: Secondary | ICD-10-CM

## 2021-09-23 DIAGNOSIS — Z7689 Persons encountering health services in other specified circumstances: Secondary | ICD-10-CM

## 2021-09-23 DIAGNOSIS — E559 Vitamin D deficiency, unspecified: Secondary | ICD-10-CM

## 2021-09-23 DIAGNOSIS — I10 Essential (primary) hypertension: Secondary | ICD-10-CM | POA: Diagnosis not present

## 2021-09-23 DIAGNOSIS — R1115 Cyclical vomiting syndrome unrelated to migraine: Secondary | ICD-10-CM

## 2021-09-23 MED ORDER — METOPROLOL TARTRATE 25 MG PO TABS: 25.0000 mg | ORAL_TABLET | Freq: Two times a day (BID) | ORAL | 0 refills | Status: DC

## 2021-09-23 MED ORDER — LORAZEPAM 0.5 MG PO TABS: 0.5000 mg | ORAL_TABLET | Freq: Two times a day (BID) | ORAL | 0 refills | Status: DC | PRN

## 2021-09-23 NOTE — Assessment & Plan Note (Signed)
Care established History and medications reviewed with the patient 

## 2021-09-23 NOTE — Assessment & Plan Note (Signed)
Smokes 0.5 pack/day ? ?Asked about quitting: confirms that she currently smokes cigarettes ?Advise to quit smoking: Educated about QUITTING to reduce the risk of cancer, cardio and cerebrovascular disease. ?Assess willingness: Unwilling to quit at this time, but is working on cutting back. ?Assist with counseling and pharmacotherapy: Counseled for 5 minutes and literature provided. ?Arrange for follow up: Follow up in 3 months and continue to offer help. ?

## 2021-09-23 NOTE — Assessment & Plan Note (Signed)
Her vomiting spells appear to be related to marijuana use Advised to strictly avoid marijuana use Takes Phenergan PRN

## 2021-09-23 NOTE — Patient Instructions (Signed)
Please start taking Metoprolol as prescribed.  Continue to take Prozac and Ativan as prescribed. Additional refills of Ativan to be prescribed by Psychiatry.

## 2021-09-23 NOTE — Progress Notes (Signed)
New Patient Office Visit  Subjective:  Patient ID: Meagan Mason, female    DOB: 03-22-1981  Age: 40 y.o. MRN: [id number]  CC:  Chief Complaint  Patient presents with   New Patient (Initial Visit)    New patient was seeing dr Epifania Gore in [place] pt is out of anxiety meds     HPI Meagan Mason is a 40 year old female with PMH of GAD/panic disorder, cyclic vomiting/gastritis and tobacco abuse who presents for establishing care.  She has history of GAD/panic disorder, and follows up with psychiatric NP in [facility].  She is on Prozac and as needed Ativan for panic spells.  She reports having dyspnea, chest tightness and severe bouts of vomiting when she has panic episode.  She has run out of her Ativan recently, as her psychiatric provider is out of office currently.  She requests a refill of Ativan for now.  Due to her spells of vomiting, she has had severe dehydration at times leading to ER visit for IV hydration.  She takes potassium supplements for hypokalemia at times.  Of note, she reports getting IV hydration at home through medical van/home care hydration (?). Of note, she reports using Marijuana at times, which can also cause cyclic vomiting.  She has had evaluation in the past, and was told that her vomiting is not from GI etiology and is more related to her anxiety/panic episodes.  Denies any diarrhea, melena or hematochezia.  Her BP was elevated in the office today upon multiple measurements.  She reports palpitations at times during anxiety spells and attributes her high BP to it. She has had elevated BP in the past during such spells.  She denies any headache, dizziness, chest pain or dyspnea currently.  She denies flu vaccine today.      Past Medical History:  Diagnosis Date   Allergy    Anxiety    Asthma    states only when stressed out   Depression    History of bronchitis    in college   Neck pain    ruptured disc in neck   URI (upper respiratory infection)     early Nov 2014   Weakness    numbness in left arm    Past Surgical History:  Procedure Laterality Date   WISDOM TOOTH EXTRACTION      Family History  Problem Relation Age of Onset   Heart disease Mother    Hypertension Mother    Heart attack Mother 5   Early death Father    Cancer Father 48       stomach cancer   Stroke Father    Heart attack Father        After stroke   Breast cancer Paternal Grandmother 37    Social History   Socioeconomic History   Marital status: Single    Spouse name: Not on file   Number of children: Not on file   Years of education: Not on file   Highest education level: Not on file  Occupational History   Not on file  Tobacco Use   Smoking status: Every Day    Packs/day: 1.00    Years: 10.00    Pack years: 10.00    Types: Cigarettes   Smokeless tobacco: Never  Vaping Use   Vaping Use: Never used  Substance and Sexual Activity   Alcohol use: Yes    Comment: wine 2-3 times a week   Drug use: Yes    Types: Marijuana  Sexual activity: Yes  Other Topics Concern   Not on file  Social History Narrative   Engaged.   College degree, bachelors. Works in [employer]or, [employer].   Occasional alcohol. Every day smoker. No drugs.   Drink caffeinated beverages.   Wears her seatbelt and bicycle helmet.   Smoke detector at home.   Social Determinants of Health   Financial Resource Strain: Not on file  Food Insecurity: Not on file  Transportation Needs: Not on file  Physical Activity: Not on file  Stress: Not on file  Social Connections: Not on file  Intimate Partner Violence: Not on file    ROS Review of Systems  Constitutional:  Positive for fatigue. Negative for chills and fever.  HENT:  Negative for congestion, sinus pressure, sinus pain and sore throat.   Eyes:  Negative for pain and discharge.  Respiratory:  Negative for cough and shortness of breath.   Cardiovascular:  Positive for palpitations. Negative for chest pain.   Gastrointestinal:  Positive for abdominal pain, nausea and vomiting. Negative for constipation.  Endocrine: Negative for polydipsia and polyuria.  Genitourinary:  Negative for dysuria and hematuria.  Musculoskeletal:  Negative for neck pain and neck stiffness.  Skin:  Negative for rash.  Neurological:  Negative for dizziness and weakness.  Psychiatric/Behavioral:  Positive for agitation, decreased concentration and sleep disturbance. Negative for behavioral problems. The patient is nervous/anxious.    Objective:   Today's Vitals: BP (!) 162/98 (BP Location: Left Arm, Cuff Size: Normal)   Pulse (!) 102   Temp 99.1 F (37.3 C) (Oral)   Resp 18   Ht '5\' 4"'  (1.626 m)   Wt 136 lb 1.9 oz (61.7 kg)   SpO2 99%   BMI 23.36 kg/m   Physical Exam Vitals reviewed.  Constitutional:      General: She is not in acute distress.    Appearance: She is not diaphoretic.  HENT:     Head: Normocephalic and atraumatic.     Nose: Nose normal.     Mouth/Throat:     Mouth: Mucous membranes are moist.  Eyes:     General: No scleral icterus.    Extraocular Movements: Extraocular movements intact.  Cardiovascular:     Rate and Rhythm: Regular rhythm. Tachycardia present.     Pulses: Normal pulses.     Heart sounds: Normal heart sounds. No murmur heard. Pulmonary:     Breath sounds: Normal breath sounds. No wheezing or rales.  Abdominal:     Palpations: Abdomen is soft.     Tenderness: There is no abdominal tenderness.  Musculoskeletal:     Cervical back: Neck supple. No tenderness.     Right lower leg: No edema.     Left lower leg: No edema.  Skin:    General: Skin is warm.     Findings: No rash.  Neurological:     General: No focal deficit present.     Mental Status: She is alert and oriented to person, place, and time.     Sensory: No sensory deficit.     Motor: No weakness.  Psychiatric:        Mood and Affect: Mood normal.        Behavior: Behavior normal.    Assessment & Plan:    Problem List Items Addressed This Visit       Encounter to establish care - Primary   Care established History and medications reviewed with the patient     Relevant Orders  CBC  with Differential/Platelet  CMP14+EGFR  Lipid Profile  TSH    Cardiovascular and Mediastinum   Essential hypertension    BP Readings from Last 1 Encounters:  09/23/21 (!) 162/98  New-onset, started Metoprolol 25 mg BID considering anxiety and palpitations Counseled for compliance with the medications Advised DASH diet and moderate exercise/walking, at least 150 mins/week       Relevant Medications   metoprolol tartrate (LOPRESSOR) 25 MG tablet     Digestive   Cyclical vomiting    Her vomiting spells appear to be related to marijuana use Advised to strictly avoid marijuana use Takes Phenergan PRN        Other   Tobacco abuse    Smokes 0.5 pack/day  Asked about quitting: confirms that she currently smokes cigarettes Advise to quit smoking: Educated about QUITTING to reduce the risk of cancer, cardio and cerebrovascular disease. Assess willingness: Unwilling to quit at this time, but is working on cutting back. Assist with counseling and pharmacotherapy: Counseled for 5 minutes and literature provided. Arrange for follow up: Follow up in 3 months and continue to offer help.         GAD (generalized anxiety disorder)    Reports panic episodes, where she has severe vomiting, dyspnea and chest tightness On Prozac Ativan PRN for panic spells [facility] in [place], [employer] Her Psychiatry provider is not at her office currently - refilled Ativan 0.5 mg PRN for 30 tablets for now, advised that she needs to obtain further refills from her Psychiatry clinic. PDMP reviewed.      Relevant Medications   LORazepam (ATIVAN) 0.5 MG tablet   Other Visit Diagnoses     Refused influenza vaccine       Vitamin D deficiency       Relevant Orders   Vitamin D (25  hydroxy)   Need for hepatitis C screening test       Relevant Orders   Hepatitis C Antibody   Encounter for screening for HIV       Relevant Orders   HIV antibody (with reflex)       Outpatient Encounter Medications as of 09/23/2021  Medication Sig   ALBUTEROL IN Inhale into the lungs.   clobetasol cream (TEMOVATE) 1.19 % Apply 1 application topically 2 (two) times daily.   FLUoxetine (PROZAC) 20 MG capsule Take 60 mg by mouth every morning.   loratadine (CLARITIN) 10 MG tablet Take 10 mg by mouth daily as needed for allergies.    metoprolol tartrate (LOPRESSOR) 25 MG tablet Take 1 tablet (25 mg total) by mouth 2 (two) times daily.   omeprazole (PRILOSEC) 40 MG capsule TAKE 1 CAPSULE BY MOUTH EVERY DAY   ondansetron (ZOFRAN ODT) 4 MG disintegrating tablet Take 1 tablet (4 mg total) by mouth every 8 (eight) hours as needed for nausea or vomiting. (Patient taking differently: Take by mouth every 8 (eight) hours as needed for nausea or vomiting.)   promethazine (PHENERGAN) 25 MG suppository Place 1 suppository (25 mg total) rectally every 8 (eight) hours as needed for nausea or vomiting.   [DISCONTINUED] LORazepam (ATIVAN) 0.5 MG tablet Take 1 tablet (0.5 mg total) by mouth 3 (three) times daily as needed for anxiety. FROM PSYCHIATRY   LORazepam (ATIVAN) 0.5 MG tablet Take 1 tablet (0.5 mg total) by mouth 2 (two) times daily as needed for anxiety. ADDITIONAL REFILLS FROM PSYCHIATRY.   potassium chloride SA (KLOR-CON) 20 MEQ tablet Take 1 tablet (20 mEq  total) by mouth 2 (two) times daily for 5 days.   [DISCONTINUED] benzonatate (TESSALON) 100 MG capsule Take 1 capsule (100 mg total) by mouth 2 (two) times daily as needed for cough. (Patient not taking: Reported on 09/23/2021)   [DISCONTINUED] CARAFATE 1 GM/10ML suspension Take 10 mLs by mouth 4 (four) times daily.   [DISCONTINUED] metoCLOPramide (REGLAN) 10 MG tablet Take 1 tablet (10 mg total) by mouth every 6 (six) hours as needed for nausea  or vomiting.   [DISCONTINUED] potassium chloride (MICRO-K) 10 MEQ CR capsule Take 2 capsules (20 mEq total) by mouth 2 (two) times daily. (Patient not taking: Reported on 09/23/2021)   No facility-administered encounter medications on file as of 09/23/2021.    Follow-up: Return in about 3 weeks (around 10/14/2021) for HTN.   Lindell Spar, MD

## 2021-09-23 NOTE — Assessment & Plan Note (Addendum)
Reports panic episodes, where she has severe vomiting, dyspnea and chest tightness On Prozac Ativan PRN for panic spells Followed by [facility] in [place], [facility] Her Psychiatry provider is not at her office currently - refilled Ativan 0.5 mg PRN for 30 tablets for now, advised that she needs to obtain further refills from her Psychiatry clinic. PDMP reviewed.

## 2021-09-23 NOTE — Assessment & Plan Note (Signed)
BP Readings from Last 1 Encounters:  09/23/21 (!) 162/98   New-onset, started Metoprolol 25 mg BID considering anxiety and palpitations Counseled for compliance with the medications Advised DASH diet and moderate exercise/walking, at least 150 mins/week

## 2021-09-24 LAB — CMP14+EGFR
ALT: 8 IU/L (ref 0–32)
AST: 18 IU/L (ref 0–40)
Albumin/Globulin Ratio: 1.5 (ref 1.2–2.2)
Albumin: 4.6 g/dL (ref 3.8–4.8)
Alkaline Phosphatase: 60 IU/L (ref 44–121)
BUN/Creatinine Ratio: 13 (ref 9–23)
BUN: 8 mg/dL (ref 6–20)
Bilirubin Total: 0.3 mg/dL (ref 0.0–1.2)
CO2: 21 mmol/L (ref 20–29)
Calcium: 9.7 mg/dL (ref 8.7–10.2)
Chloride: 95 mmol/L — ABNORMAL LOW (ref 96–106)
Creatinine, Ser: 0.64 mg/dL (ref 0.57–1.00)
Globulin, Total: 3.1 g/dL (ref 1.5–4.5)
Glucose: 108 mg/dL — ABNORMAL HIGH (ref 70–99)
Potassium: 4.9 mmol/L (ref 3.5–5.2)
Sodium: 134 mmol/L (ref 134–144)
Total Protein: 7.7 g/dL (ref 6.0–8.5)
eGFR: 116 mL/min/{1.73_m2} (ref >59)

## 2021-09-24 LAB — CBC WITH DIFFERENTIAL/PLATELET
Basophils Absolute: 0.1 10*3/uL (ref 0.0–0.2)
Basos: 1 %
EOS (ABSOLUTE): 0.1 10*3/uL (ref 0.0–0.4)
Eos: 1 %
Hematocrit: 39.4 % (ref 34.0–46.6)
Hemoglobin: 14 g/dL (ref 11.1–15.9)
Immature Grans (Abs): 0 10*3/uL (ref 0.0–0.1)
Immature Granulocytes: 0 %
Lymphocytes Absolute: 2.4 10*3/uL (ref 0.7–3.1)
Lymphs: 26 %
MCH: 32.9 pg (ref 26.6–33.0)
MCHC: 35.5 g/dL (ref 31.5–35.7)
MCV: 93 fL (ref 79–97)
Monocytes Absolute: 1 10*3/uL — ABNORMAL HIGH (ref 0.1–0.9)
Monocytes: 11 %
Neutrophils Absolute: 5.7 10*3/uL (ref 1.4–7.0)
Neutrophils: 61 %
Platelets: 308 10*3/uL (ref 150–450)
RBC: 4.26 x10E6/uL (ref 3.77–5.28)
RDW: 13.2 % (ref 11.7–15.4)
WBC: 9.2 10*3/uL (ref 3.4–10.8)

## 2021-09-24 LAB — VITAMIN D 25 HYDROXY (VIT D DEFICIENCY, FRACTURES): Vit D, 25-Hydroxy: 12.9 ng/mL — ABNORMAL LOW (ref 30.0–100.0)

## 2021-09-24 LAB — HEPATITIS C ANTIBODY: Hep C Virus Ab: <0.1 s/co ratio (ref 0.0–0.9)

## 2021-09-24 LAB — LIPID PANEL
Chol/HDL Ratio: 2.5 ratio (ref 0.0–4.4)
Cholesterol, Total: 288 mg/dL — ABNORMAL HIGH (ref 100–199)
HDL: 117 mg/dL (ref >39)
LDL Chol Calc (NIH): 156 mg/dL — ABNORMAL HIGH (ref 0–99)
Triglycerides: 93 mg/dL (ref 0–149)
VLDL Cholesterol Cal: 15 mg/dL (ref 5–40)

## 2021-09-24 LAB — TSH: TSH: 0.725 u[IU]/mL (ref 0.450–4.500)

## 2021-09-24 LAB — HIV ANTIBODY (ROUTINE TESTING W REFLEX): HIV Screen 4th Generation wRfx: NONREACTIVE

## 2021-09-24 MED ORDER — VITAMIN D (ERGOCALCIFEROL) 1.25 MG (50000 UNIT) PO CAPS: 50000.0000 [IU] | ORAL_CAPSULE | ORAL | 5 refills | Status: DC

## 2021-09-24 NOTE — Addendum Note (Signed)
Addended byTrena Platt on: 09/24/2021 08:21 AM   Modules accepted: Orders

## 2021-10-01 ENCOUNTER — Telehealth: Payer: Self-pay | Admitting: Internal Medicine

## 2021-10-01 ENCOUNTER — Other Ambulatory Visit: Payer: Self-pay | Admitting: *Deleted

## 2021-10-01 DIAGNOSIS — F411 Generalized anxiety disorder: Secondary | ICD-10-CM

## 2021-10-01 NOTE — Telephone Encounter (Signed)
Referral placed to [facility]

## 2021-10-01 NOTE — Telephone Encounter (Signed)
Pt called In about [facility] therapist as well as psychiatrist referral. Current therapist has not returned to office and other doesn't accept insurance. Wants to see if she can get new referrals in.

## 2021-10-04 ENCOUNTER — Other Ambulatory Visit: Payer: Self-pay | Admitting: Family Medicine

## 2021-10-04 DIAGNOSIS — K2970 Gastritis, unspecified, without bleeding: Secondary | ICD-10-CM

## 2021-10-14 ENCOUNTER — Ambulatory Visit: Payer: 59 | Admitting: Internal Medicine

## 2021-10-22 ENCOUNTER — Other Ambulatory Visit: Payer: Self-pay | Admitting: Internal Medicine

## 2021-10-22 DIAGNOSIS — I10 Essential (primary) hypertension: Secondary | ICD-10-CM

## 2021-11-02 ENCOUNTER — Telehealth: Payer: Self-pay | Admitting: Internal Medicine

## 2021-11-02 NOTE — Telephone Encounter (Signed)
Pt called back in regards to Zofran   Pt states she needs med for the sickness / nausea episodes that she is having   Pt states that Zofran is the only thing  that helps with the nausea and without it she cant keep anything down

## 2021-11-02 NOTE — Telephone Encounter (Signed)
This is an as needed medication what is she needing this for she has not had since 3-22

## 2021-11-02 NOTE — Telephone Encounter (Signed)
Ondansetron Zofran--please send to [facility] [address]

## 2021-11-02 NOTE — Telephone Encounter (Signed)
Pt has not had med filled since march if having problems still with nausea will need to make a virtual appt with provider

## 2021-11-03 ENCOUNTER — Other Ambulatory Visit: Payer: Self-pay

## 2021-11-03 ENCOUNTER — Encounter: Payer: Self-pay | Admitting: Family Medicine

## 2021-11-03 ENCOUNTER — Ambulatory Visit: Payer: 59 | Admitting: Internal Medicine

## 2021-11-03 ENCOUNTER — Ambulatory Visit (INDEPENDENT_AMBULATORY_CARE_PROVIDER_SITE_OTHER): Payer: 59 | Admitting: Family Medicine

## 2021-11-03 DIAGNOSIS — F411 Generalized anxiety disorder: Secondary | ICD-10-CM

## 2021-11-03 DIAGNOSIS — R110 Nausea: Secondary | ICD-10-CM

## 2021-11-03 MED ORDER — ONDANSETRON HCL 4 MG PO TABS: ORAL_TABLET | ORAL | 0 refills | Status: DC

## 2021-11-03 NOTE — Progress Notes (Signed)
Virtual Visit via Telephone Note  I connected with Meagan Mason on 11/03/21 at  4:40 PM EST by telephone and verified that I am speaking with the correct person using two identifiers.  Location: Patient: home Provider: office   I discussed the limitations, risks, security and privacy concerns of performing an evaluation and management service by telephone and the availability of in person appointments. I also discussed with the patient that there may be a patient responsible charge related to this service. The patient expressed understanding and agreed to proceed.   History of Present Illness: C/o uncontrolled nausea, states this is a chronic problem which flares when her anxiety is uncontrolled , states currently anxiety is uncontroled and she has recently established/ is in the process of establishing with a new Psychiatrist, states her previous Psych was prescriboing zofran for her and also metioned phenergan,states she has nausea , loose stool and feels weak all at one time when she has an anxiety flare   Observations/Objective: There were no vitals taken for this visit. Good communication with no confusion and intact memory. Alert and oriented x 3 No signs of respiratory distress during speech   Assessment and Plan:  Nausea Reports nausea associated with uncontrolled anxiety and reports currently not well controlled , zofran prescribed in limited supply with f/u with PCP  GAD (generalized anxiety disorder) Reports not doing well and has upcoming appointment with Psych  Follow Up Instructions:    I discussed the assessment and treatment plan with the patient. The patient was provided an opportunity to ask questions and all were answered. The patient agreed with the plan and demonstrated an understanding of the instructions.   The patient was advised to call back or seek an in-person evaluation if the symptoms worsen or if the condition fails to improve as anticipated.  I  provided 11 minutes of non-face-to-face time during this encounter.   Syliva Overman, MD

## 2021-11-03 NOTE — Assessment & Plan Note (Signed)
Reports not doing well and has upcoming appointment with Psych

## 2021-11-03 NOTE — Patient Instructions (Signed)
F/U with Dr Allena Katz in 3 to 6 weeks, call if you need to be seen sooner  Medication is sent for nausea  Thanks for choosing [facility], we consider it a privelige to serve you.

## 2021-11-03 NOTE — Addendum Note (Signed)
Addended by: Kerri Perches on: 11/03/2021 04:57 PM   Modules accepted: Orders

## 2021-11-03 NOTE — Assessment & Plan Note (Signed)
Reports nausea associated with uncontrolled anxiety and reports currently not well controlled , zofran prescribed in limited supply with f/u with PCP

## 2021-12-07 ENCOUNTER — Ambulatory Visit: Payer: 59 | Admitting: Internal Medicine

## 2021-12-07 ENCOUNTER — Encounter: Payer: Self-pay | Admitting: Internal Medicine

## 2022-03-01 NOTE — Progress Notes (Deleted)
?Charlann Boxer D.O. ?[facility] ?[place] ?Phone: [phone] ?Subjective:   ? ?I'm seeing this patient by the request  of:  No primary care provider on file. ? ?CC: left shoulder and neck pain  ? ?[id number]  ?Meagan Mason is a 41 y.o. female coming in with complaint of left shoulder and neck pain. ? ?Onset-  ?Location ?Duration-  ?Character- ?Aggravating factors- ?Reliving factors-  ?Therapies tried-  ?Severity- ? ?  ? ?Past Medical History:  ?Diagnosis Date  ? Allergy   ? Anxiety   ? Asthma   ? states only when stressed out  ? Depression   ? History of bronchitis   ? in college  ? Neck pain   ? ruptured disc in neck  ? URI (upper respiratory infection)   ? early Nov 2014  ? Weakness   ? numbness in left arm  ? ?Past Surgical History:  ?Procedure Laterality Date  ? WISDOM TOOTH EXTRACTION    ? ?Social History  ? ?Socioeconomic History  ? Marital status: Single  ?  Spouse name: Not on file  ? Number of children: Not on file  ? Years of education: Not on file  ? Highest education level: Not on file  ?Occupational History  ? Not on file  ?Tobacco Use  ? Smoking status: Every Day  ?  Packs/day: 1.00  ?  Years: 10.00  ?  Pack years: 10.00  ?  Types: Cigarettes  ? Smokeless tobacco: Never  ?Vaping Use  ? Vaping Use: Never used  ?Substance and Sexual Activity  ? Alcohol use: Yes  ?  Comment: wine 2-3 times a week  ? Drug use: Yes  ?  Types: Marijuana  ? Sexual activity: Yes  ?Other Topics Concern  ? Not on file  ?Social History Narrative  ? Engaged.  ? College degree, bachelors. Works in [employer]or, [employer].  ? Occasional alcohol. Every day smoker. No drugs.  ? Drink caffeinated beverages.  ? Wears her seatbelt and bicycle helmet.  ? Smoke detector at home.  ? ?Social Determinants of Health  ? ?Financial Resource Strain: Not on file  ?Food Insecurity: Not on file  ?Transportation Needs: Not on file  ?Physical Activity: Not on file  ?Stress: Not on file  ?Social  Connections: Not on file  ? ?Allergies  ?Allergen Reactions  ? Other Shortness Of Breath and Itching  ?  Pecans, tree nuts   ? Benadryl [Diphenhydramine] Other (See Comments)  ?  hyperactivity  ? ?Family History  ?Problem Relation Age of Onset  ? Heart disease Mother   ? Hypertension Mother   ? Heart attack Mother 70  ? Early death Father   ? Cancer Father 94  ?     stomach cancer  ? Stroke Father   ? Heart attack Father   ?     After stroke  ? Breast cancer Paternal Grandmother 65  ? ? ? ?Current Outpatient Medications (Cardiovascular):  ?  metoprolol tartrate (LOPRESSOR) 25 MG tablet, TAKE 1 TABLET BY MOUTH TWICE A DAY ? ?Current Outpatient Medications (Respiratory):  ?  ALBUTEROL IN, Inhale into the lungs. ?  loratadine (CLARITIN) 10 MG tablet, Take 10 mg by mouth daily as needed for allergies.  ?  promethazine (PHENERGAN) 25 MG suppository, Place 1 suppository (25 mg total) rectally every 8 (eight) hours as needed for nausea or vomiting. ? ? ? ?Current Outpatient Medications (Other):  ?  clobetasol cream (TEMOVATE) 0.05 %, Apply  1 application topically 2 (two) times daily. ?  FLUoxetine (PROZAC) 20 MG capsule, Take 60 mg by mouth every morning. ?  LORazepam (ATIVAN) 0.5 MG tablet, Take 1 tablet (0.5 mg total) by mouth 2 (two) times daily as needed for anxiety. ADDITIONAL REFILLS FROM PSYCHIATRY. ?  omeprazole (PRILOSEC) 40 MG capsule, TAKE 1 CAPSULE BY MOUTH EVERY DAY ?  ondansetron (ZOFRAN ODT) 4 MG disintegrating tablet, Take 1 tablet (4 mg total) by mouth every 8 (eight) hours as needed for nausea or vomiting. (Patient taking differently: Take by mouth every 8 (eight) hours as needed for nausea or vomiting.) ?  ondansetron (ZOFRAN) 4 MG tablet, Take one tablet by mouth once daily , as needed, for nausea associated with panic and anxiety ?  potassium chloride SA (KLOR-CON) 20 MEQ tablet, Take 1 tablet (20 mEq total) by mouth 2 (two) times daily for 5 days. ?  Vitamin D, Ergocalciferol, (DRISDOL) 1.25 MG (50000  UNIT) CAPS capsule, Take 1 capsule (50,000 Units total) by mouth every 7 (seven) days. ? ? ?Reviewed prior external information including notes and imaging from  ?primary care provider ?As well as notes that were available from care everywhere and other healthcare systems. ? ?Past medical history, social, surgical and family history all reviewed in electronic medical record.  No pertanent information unless stated regarding to the chief complaint.  ? ?Review of Systems: ? No headache, visual changes, nausea, vomiting, diarrhea, constipation, dizziness, abdominal pain, skin rash, fevers, chills, night sweats, weight loss, swollen lymph nodes, body aches, joint swelling, chest pain, shortness of breath, mood changes. POSITIVE muscle aches ? ?Objective  ?There were no vitals taken for this visit. ?  ?General: No apparent distress alert and oriented x3 mood and affect normal, dressed appropriately.  ?HEENT: Pupils equal, extraocular movements intact  ?Respiratory: Patient's speak in full sentences and does not appear short of breath  ?Cardiovascular: No lower extremity edema, non tender, no erythema  ?Gait normal with good balance and coordination.  ?MSK:   ?Left shoulder  ?Neck exam  ?  ?Impression and Recommendations:  ?  ?The above documentation has been reviewed and is accurate and complete Lyndal Pulley, DO ? ? ? ?

## 2022-03-02 ENCOUNTER — Ambulatory Visit: Payer: Self-pay | Admitting: Family Medicine

## 2022-03-22 ENCOUNTER — Emergency Department (HOSPITAL_COMMUNITY)
Admission: EM | Admit: 2022-03-22 | Discharge: 2022-03-22 | Discharge status: Against Medical Advice | Payer: Managed Care, Other (non HMO) | Source: Home / Self Care

## 2022-04-02 ENCOUNTER — Telehealth: Payer: Self-pay

## 2022-04-15 ENCOUNTER — Ambulatory Visit: Payer: Self-pay | Admitting: Family Medicine

## 2022-04-16 NOTE — Telephone Encounter (Signed)
error 

## 2022-04-23 ENCOUNTER — Ambulatory Visit
Admission: EM | Admit: 2022-04-23 | Discharge: 2022-04-23 | Disposition: A | Discharge status: Home / Self Care | Payer: Commercial Managed Care - HMO | Attending: Family Medicine | Admitting: Family Medicine

## 2022-04-23 DIAGNOSIS — R29898 Other symptoms and signs involving the musculoskeletal system: Secondary | ICD-10-CM | POA: Diagnosis not present

## 2022-04-23 DIAGNOSIS — R030 Elevated blood-pressure reading, without diagnosis of hypertension: Secondary | ICD-10-CM | POA: Diagnosis not present

## 2022-04-23 DIAGNOSIS — M5412 Radiculopathy, cervical region: Secondary | ICD-10-CM

## 2022-04-23 MED ORDER — DEXAMETHASONE SODIUM PHOSPHATE 10 MG/ML IJ SOLN
10.0000 mg | Freq: Once | INTRAMUSCULAR | Status: AC
  Administered 2022-04-23: 10 mg via INTRAMUSCULAR

## 2022-04-23 MED ORDER — CYCLOBENZAPRINE HCL 10 MG PO TABS: 10.0000 mg | ORAL_TABLET | Freq: Every evening | ORAL | 0 refills | Status: DC | PRN

## 2022-04-23 NOTE — ED Triage Notes (Signed)
Pt states that since the 24th of February her she injured herself while shoveling  some things  Pt states she is having pain coming from her neck going down into her left arm and fingers  Pt states she had a rupture between disc 4 and 5  in 2015.   Pt states she has had some steroid shots and physical therapy and Voltaren gel without much relief

## 2022-04-23 NOTE — ED Provider Notes (Signed)
[facility]    CSN: [id number] Arrival date & time: 04/23/22  1350      History   Chief Complaint No chief complaint on file.   HPI Meagan Mason is a 41 y.o. female.   Presenting today with several month history of progressively worsening left-sided neck pain radiating down the left arm, now creating weakness, numbness ongoing.  She states the pain is worsening over the past week or so despite lidocaine patches, Voltaren gel, stretches, exercises, Epsom salt soaks.  States that she was shoveling maneuver into a back of a pickup truck when symptoms initially started.  History of herniated disc at C4 and C5 in 2015 resolved with steroid injections and physical therapy.  Denies loss of range of motion, swelling, severe headache.  Past Medical History:  Diagnosis Date   Allergy    Anxiety    Asthma    states only when stressed out   Depression    History of bronchitis    in college   Neck pain    ruptured disc in neck   URI (upper respiratory infection)    early Nov 2014   Weakness    numbness in left arm    Patient Active Problem List   Diagnosis Date Noted   Nausea [id number]   Cyclical vomiting [id number]   Essential hypertension 09/23/2021   GAD (generalized anxiety disorder) 04/24/2019   Gastritis 02/06/2016   Tobacco abuse 02/06/2016   Encounter to establish care 02/06/2016    Past Surgical History:  Procedure Laterality Date   WISDOM TOOTH EXTRACTION      OB History     Gravida  0   Para  0   Term  0   Preterm  0   AB  0   Living  0      SAB  0   IAB  0   Ectopic  0   Multiple  0   Live Births  0            Home Medications    Prior to Admission medications   Medication Sig Start Date End Date Taking? Authorizing Provider  cyclobenzaprine (FLEXERIL) 10 MG tablet Take 1 tablet (10 mg total) by mouth at bedtime as needed for muscle spasms. Do not drink alcohol or drive while taking this medication.  May cause  drowsiness. 04/23/22  Yes Volney American, PA-C  ALBUTEROL IN Inhale into the lungs.    [provider]  clobetasol cream (TEMOVATE) [id number] % Apply 1 application topically 2 (two) times daily. 05/28/20   Evelina Dun A, FNP  FLUoxetine (PROZAC) 20 MG capsule Take 60 mg by mouth every morning. 05/08/20   [provider]  loratadine (CLARITIN) 10 MG tablet Take 10 mg by mouth daily as needed for allergies.     [provider]  LORazepam (ATIVAN) 0.5 MG tablet Take 1 tablet (0.5 mg total) by mouth 2 (two) times daily as needed for anxiety. ADDITIONAL REFILLS FROM PSYCHIATRY. 09/23/21   Lindell Spar, MD  metoprolol tartrate (LOPRESSOR) 25 MG tablet TAKE 1 TABLET BY MOUTH TWICE A DAY 10/22/21   Lindell Spar, MD  omeprazole (PRILOSEC) 40 MG capsule TAKE 1 CAPSULE BY MOUTH EVERY DAY 10/05/21   Koberlein, Steele Berg, MD  ondansetron (ZOFRAN ODT) 4 MG disintegrating tablet Take 1 tablet (4 mg total) by mouth every 8 (eight) hours as needed for nausea or vomiting. Patient taking differently: Take by mouth every 8 (eight) hours as  needed for nausea or vomiting. 02/06/21   Caren Macadam, MD  ondansetron (ZOFRAN) 4 MG tablet Take one tablet by mouth once daily , as needed, for nausea associated with panic and anxiety 11/03/21   Fayrene Helper, MD  potassium chloride SA (KLOR-CON) 20 MEQ tablet Take 1 tablet (20 mEq total) by mouth 2 (two) times daily for 5 days. 06/06/21 06/11/21  Marcello Fennel, PA-C  promethazine (PHENERGAN) 25 MG suppository Place 1 suppository (25 mg total) rectally every 8 (eight) hours as needed for nausea or vomiting. 06/06/21   Marcello Fennel, PA-C  Vitamin D, Ergocalciferol, (DRISDOL) 1.25 MG (50000 UNIT) CAPS capsule Take 1 capsule (50,000 Units total) by mouth every 7 (seven) days. 09/24/21   Lindell Spar, MD  CARAFATE 1 GM/10ML suspension Take 10 mLs by mouth 4 (four) times daily. 07/05/19 10/22/19  [provider]   metoCLOPramide (REGLAN) 10 MG tablet Take 1 tablet (10 mg total) by mouth every 6 (six) hours as needed for nausea or vomiting. 07/06/19 10/22/19  Julianne Rice, MD    Family History Family History  Problem Relation Age of Onset   Heart disease Mother    Hypertension Mother    Heart attack Mother 11   Early death Father    Cancer Father 91       stomach cancer   Stroke Father    Heart attack Father        After stroke   Breast cancer Paternal Grandmother 23    Social History Social History   Tobacco Use   Smoking status: Every Day    Packs/day: 1.00    Years: 10.00    Pack years: 10.00    Types: Cigarettes   Smokeless tobacco: Never  Vaping Use   Vaping Use: Never used  Substance Use Topics   Alcohol use: Yes    Comment: wine 2-3 times a week   Drug use: Yes    Types: Marijuana     Allergies   Other and Benadryl [diphenhydramine]   Review of Systems Review of Systems Per HPI  Physical Exam Triage Vital Signs ED Triage Vitals  Enc Vitals Group     BP 04/23/22 1546 (!) 187/121     Pulse Rate 04/23/22 1546 (!) 111     Resp 04/23/22 1546 20     Temp 04/23/22 1546 99.4 F (37.4 C)     Temp Source 04/23/22 1546 Oral     SpO2 04/23/22 1546 98 %     Weight --      Height --      Head Circumference --      Peak Flow --      Pain Score 04/23/22 1552 8     Pain Loc --      Pain Edu? --      Excl. in [place]? --    No data found.  Updated Vital Signs BP (!) 175/112 (BP Location: Right Arm)   Pulse 89   Temp 99.4 F (37.4 C) (Oral)   Resp 20   LMP 04/20/2022 (Exact Date)   SpO2 98%   Visual Acuity Right Eye Distance:   Left Eye Distance:   Bilateral Distance:    Right Eye Near:   Left Eye Near:    Bilateral Near:     Physical Exam Vitals and nursing note reviewed.  Constitutional:      Appearance: Normal appearance. She is not ill-appearing.  HENT:     Head: Atraumatic.  Eyes:     Extraocular Movements: Extraocular movements intact.      Conjunctiva/sclera: Conjunctivae normal.  Cardiovascular:     Rate and Rhythm: Normal rate and regular rhythm.     Heart sounds: Normal heart sounds.  Pulmonary:     Effort: Pulmonary effort is normal.     Breath sounds: Normal breath sounds.  Musculoskeletal:        General: Signs of injury present. No swelling, tenderness or deformity. Normal range of motion.     Cervical back: Normal range of motion and neck supple.     Comments: Good range of motion in the left upper extremity, no midline cervical spinal tenderness to palpation.  Mildly decreased grip strength left hand  Skin:    General: Skin is warm and dry.  Neurological:     Mental Status: She is alert and oriented to person, place, and time.     Sensory: Sensory deficit present.     Comments: Mildly decreased sensation to light touch the left arm  Psychiatric:        Mood and Affect: Mood normal.        Thought Content: Thought content normal.        Judgment: Judgment normal.   UC Treatments / Results  Labs (all labs ordered are listed, but only abnormal results are displayed) Labs Reviewed - No data to display  EKG   Radiology No results found.  Procedures Procedures (including critical care time)  Medications Ordered in UC Medications  dexamethasone (DECADRON) injection 10 mg (10 mg Intramuscular Given 04/23/22 1611)    Initial Impression / Assessment and Plan / UC Course  I have reviewed the triage vital signs and the nursing notes.  Pertinent labs & imaging results that were available during my care of the patient were reviewed by me and considered in my medical decision making (see chart for details).     Treat with IM Decadron, continue supportive over-the-counter measures and home care.  Has an appointment with her primary care and orthopedist next week to follow-up on these things as well as follow-up on her blood pressure.  She states that they are altering her blood pressure regimen next week as her  readings have continued to be high.  She denies any symptoms from this.    Final Clinical Impressions(s) / UC Diagnoses   Final diagnoses:  Cervical radiculitis  Elevated blood pressure reading  Left arm weakness   Discharge Instructions   None    ED Prescriptions     Medication Sig Dispense Auth. Provider   cyclobenzaprine (FLEXERIL) 10 MG tablet Take 1 tablet (10 mg total) by mouth at bedtime as needed for muscle spasms. Do not drink alcohol or drive while taking this medication.  May cause drowsiness. 10 tablet Volney American, [place]      PDMP not reviewed this encounter.   Volney American, [place] 04/23/22 1615

## 2022-04-27 ENCOUNTER — Other Ambulatory Visit: Payer: Self-pay | Admitting: Family

## 2022-04-27 ENCOUNTER — Ambulatory Visit (INDEPENDENT_AMBULATORY_CARE_PROVIDER_SITE_OTHER): Payer: Commercial Managed Care - HMO | Admitting: Family

## 2022-04-27 VITALS — BP 138/92 | HR 92 | Temp 99.0°F | Wt 142.0 lb

## 2022-04-27 DIAGNOSIS — I10 Essential (primary) hypertension: Secondary | ICD-10-CM | POA: Diagnosis not present

## 2022-04-27 DIAGNOSIS — E782 Mixed hyperlipidemia: Secondary | ICD-10-CM | POA: Diagnosis not present

## 2022-04-27 DIAGNOSIS — F411 Generalized anxiety disorder: Secondary | ICD-10-CM

## 2022-04-27 LAB — COMPREHENSIVE METABOLIC PANEL
ALT: 16 U/L (ref 0–35)
AST: 25 U/L (ref 0–37)
Albumin: 4.2 g/dL (ref 3.5–5.2)
Alkaline Phosphatase: 52 U/L (ref 39–117)
BUN: 8 mg/dL (ref 6–23)
CO2: 27 mEq/L (ref 19–32)
Calcium: 9.4 mg/dL (ref 8.4–10.5)
Chloride: 99 mEq/L (ref 96–112)
Creatinine, Ser: 0.55 mg/dL (ref 0.40–1.20)
GFR: 116.05 mL/min (ref >60.00)
Glucose, Bld: 86 mg/dL (ref 70–99)
Potassium: 4.5 mEq/L (ref 3.5–5.1)
Sodium: 133 mEq/L — ABNORMAL LOW (ref 135–145)
Total Bilirubin: 0.3 mg/dL (ref 0.2–1.2)
Total Protein: 7.5 g/dL (ref 6.0–8.3)

## 2022-04-27 LAB — LIPID PANEL
Cholesterol: 270 mg/dL — ABNORMAL HIGH (ref 0–200)
HDL: 102 mg/dL (ref >39.00)
LDL Cholesterol: 150 mg/dL — ABNORMAL HIGH (ref 0–99)
NonHDL: 167.69
Total CHOL/HDL Ratio: 3
Triglycerides: 89 mg/dL (ref 0.0–149.0)
VLDL: 17.8 mg/dL (ref 0.0–40.0)

## 2022-04-27 MED ORDER — CHLORTHALIDONE 15 MG PO TABS: 15.0000 mg | ORAL_TABLET | Freq: Every day | ORAL | 1 refills | Status: DC

## 2022-04-27 NOTE — Progress Notes (Signed)
Acute Office Visit  Subjective:     Patient ID: Meagan Mason, female    DOB: 10/08/1981, 41 y.o.   MRN: [id number]  Chief Complaint  Patient presents with  . Hypertension    Was prescribed medication, to take 2x daily. Has not been taking, would like something once daily.    HPI Patient is in today with concerns about her blood pressure. She was seen at the ED over the weekend for Cervical Rad that has resolved. She was prescribed a blood pressure medication there that she did not take because it was prescribed twice a day. She reports being told for over a year not that she needed to be on blood pressure medication. She has a family history of HTN and heart disease. She reports being in a lot of pain Saturday when her systolic blood pressure was 177. She also has a history of HLD. Reports a history of hypokalemia related to cyclic vomiting episodes that occur related to anxiety and panic.  Review of Systems  Cardiovascular:        Elevated blood pressure  Psychiatric/Behavioral:  The patient is nervous/anxious.   All other systems reviewed and are negative.      Objective:    BP (!) 138/92 (BP Location: Left Arm, Patient Position: Sitting, Cuff Size: Normal)   Pulse 92   Temp 99 F (37.2 C) (Oral)   Wt 142 lb (64.4 kg)   LMP 04/20/2022 (Exact Date)   SpO2 98%   BMI 24.37 kg/m    Physical Exam Vitals and nursing note reviewed.  Constitutional:      Appearance: Normal appearance. She is normal weight.  HENT:     Head: Normocephalic.     Mouth/Throat:     Mouth: Mucous membranes are moist.  Eyes:     Extraocular Movements: Extraocular movements intact.     Pupils: Pupils are equal, round, and reactive to light.  Cardiovascular:     Rate and Rhythm: Normal rate and regular rhythm.     Pulses: Normal pulses.     Heart sounds: Normal heart sounds.     Comments: Recheck BP 140/80 Pulmonary:     Effort: Pulmonary effort is normal.     Breath sounds: Normal breath  sounds.  Abdominal:     General: Abdomen is flat.     Palpations: Abdomen is soft.  Musculoskeletal:        General: Normal range of motion.  Skin:    General: Skin is warm and dry.  Neurological:     General: No focal deficit present.     Mental Status: She is alert and oriented to person, place, and time.  Psychiatric:        Mood and Affect: Mood normal.        Behavior: Behavior normal.   No results found for any visits on 04/27/22.      Assessment & Plan:   Problem List Items Addressed This Visit     GAD (generalized anxiety disorder)   Essential hypertension - Primary   Relevant Medications   chlorthalidone (HYGROTEN) 15 MG tablet   Other Relevant Orders   Lipid panel   CMP   Other Visit Diagnoses     Mixed hyperlipidemia       Relevant Medications   chlorthalidone (HYGROTEN) 15 MG tablet   Other Relevant Orders   Lipid panel   CMP       Meds ordered this encounter  Medications  . chlorthalidone (HYGROTEN) 15  MG tablet    Sig: Take 1 tablet (15 mg total) by mouth daily.    Dispense:  30 tablet    Refill:  1   Chlorthalidone recommended due to her history of Hypokalemia with cyclic vomiting. Recheck in 3 weeks. Follow-up with Psychiatry as scheduled.  Return in 3 weeks (on 05/18/2022).  Eulis Foster, FNP

## 2022-04-27 NOTE — Telephone Encounter (Signed)
error 

## 2022-04-27 NOTE — Telephone Encounter (Signed)
During a result note call, the pt stated that Thalitone 15mg  is not covered by her insurance, however the 25mg  tablet is. She would like to know if it is ok to half the 25mg  tablet.

## 2022-04-27 NOTE — Patient Instructions (Signed)
Managing Your Hypertension Hypertension, also called high blood pressure, is when the force of the blood pressing against the walls of the arteries is too strong. Arteries are blood vessels that carry blood from your heart throughout your body. Hypertension forces the heart to work harder to pump blood and may cause the arteries to become narrow or stiff. Understanding blood pressure readings A blood pressure reading includes a higher number over a lower number: The first, or top, number is called the systolic pressure. It is a measure of the pressure in your arteries as your heart beats. The second, or bottom number, is called the diastolic pressure. It is a measure of the pressure in your arteries as the heart relaxes. For most people, a normal blood pressure is below 120/80. Your personal target blood pressure may vary depending on your medical conditions, your age, and other factors. Blood pressure is classified into four stages. Based on your blood pressure reading, your health care provider may use the following stages to determine what type of treatment you need, if any. Systolic pressure and diastolic pressure are measured in a unit called millimeters of mercury (mmHg). Normal Systolic pressure: below 120. Diastolic pressure: below 80. Elevated Systolic pressure: 120-129. Diastolic pressure: below 80. Hypertension stage 1 Systolic pressure: 130-139. Diastolic pressure: 80-89. Hypertension stage 2 Systolic pressure: 140 or above. Diastolic pressure: 90 or above. How can this condition affect me? Managing your hypertension is very important. Over time, hypertension can damage the arteries and decrease blood flow to parts of the body, including the brain, heart, and kidneys. Having untreated or uncontrolled hypertension can lead to: A heart attack. A stroke. A weakened blood vessel (aneurysm). Heart failure. Kidney damage. Eye damage. Memory and concentration problems. Vascular  dementia. What actions can I take to manage this condition? Hypertension can be managed by making lifestyle changes and possibly by taking medicines. Your health care provider will help you make a plan to bring your blood pressure within a normal range. You may be referred for counseling on a healthy diet and physical activity. Nutrition  Eat a diet that is high in fiber and potassium, and low in salt (sodium), added sugar, and fat. An example eating plan is called the DASH diet. DASH stands for Dietary Approaches to Stop Hypertension. To eat this way: Eat plenty of fresh fruits and vegetables. Try to fill one-half of your plate at each meal with fruits and vegetables. Eat whole grains, such as whole-wheat pasta, brown rice, or whole-grain bread. Fill about one-fourth of your plate with whole grains. Eat low-fat dairy products. Avoid fatty cuts of meat, processed or cured meats, and poultry with skin. Fill about one-fourth of your plate with lean proteins such as fish, chicken without skin, beans, eggs, and tofu. Avoid pre-made and processed foods. These tend to be higher in sodium, added sugar, and fat. Reduce your daily sodium intake. Many people with hypertension should eat less than 1,500 mg of sodium a day. Lifestyle  Work with your health care provider to maintain a healthy body weight or to lose weight. Ask what an ideal weight is for you. Get at least 30 minutes of exercise that causes your heart to beat faster (aerobic exercise) most days of the week. Activities may include walking, swimming, or biking. Include exercise to strengthen your muscles (resistance exercise), such as weight lifting, as part of your weekly exercise routine. Try to do these types of exercises for 30 minutes at least 3 days a week. Do   not use any products that contain nicotine or tobacco. These products include cigarettes, chewing tobacco, and vaping devices, such as e-cigarettes. If you need help quitting, ask your  health care provider. Control any long-term (chronic) conditions you have, such as high cholesterol or diabetes. Identify your sources of stress and find ways to manage stress. This may include meditation, deep breathing, or making time for fun activities. Alcohol use Do not drink alcohol if: Your health care provider tells you not to drink. You are pregnant, may be pregnant, or are planning to become pregnant. If you drink alcohol: Limit how much you have to: 0-1 drink a day for women. 0-2 drinks a day for men. Know how much alcohol is in your drink. In the [place] one drink equals one 12 oz bottle of beer (355 mL), one 5 oz glass of wine (148 mL), or one 1 oz glass of hard liquor (44 mL). Medicines Your health care provider may prescribe medicine if lifestyle changes are not enough to get your blood pressure under control and if: Your systolic blood pressure is 130 or higher. Your diastolic blood pressure is 80 or higher. Take medicines only as told by your health care provider. Follow the directions carefully. Blood pressure medicines must be taken as told by your health care provider. The medicine does not work as well when you skip doses. Skipping doses also puts you at risk for problems. Monitoring Before you monitor your blood pressure: Do not smoke, drink caffeinated beverages, or exercise within 30 minutes before taking a measurement. Use the bathroom and empty your bladder (urinate). Sit quietly for at least 5 minutes before taking measurements. Monitor your blood pressure at home as told by your health care provider. To do this: Sit with your back straight and supported. Place your feet flat on the floor. Do not cross your legs. Support your arm on a flat surface, such as a table. Make sure your upper arm is at heart level. Each time you measure, take two or three readings one minute apart and record the results. You may also need to have your blood pressure checked regularly by  your health care provider. General information Talk with your health care provider about your diet, exercise habits, and other lifestyle factors that may be contributing to hypertension. Review all the medicines you take with your health care provider because there may be side effects or interactions. Keep all follow-up visits. Your health care provider can help you create and adjust your plan for managing your high blood pressure. Where to find more information National Heart, Lung, and Blood Institute: www.nhlbi.nih.gov American Heart Association: www.heart.org Contact a health care provider if: You think you are having a reaction to medicines you have taken. You have repeated (recurrent) headaches. You feel dizzy. You have swelling in your ankles. You have trouble with your vision. Get help right away if: You develop a severe headache or confusion. You have unusual weakness or numbness, or you feel faint. You have severe pain in your chest or abdomen. You vomit repeatedly. You have trouble breathing. These symptoms may be an emergency. Get help right away. Call 911. Do not wait to see if the symptoms will go away. Do not drive yourself to the hospital. Summary Hypertension is when the force of blood pumping through your arteries is too strong. If this condition is not controlled, it may put you at risk for serious complications. Your personal target blood pressure may vary depending on your medical conditions,   your age, and other factors. For most people, a normal blood pressure is less than 120/80. Hypertension is managed by lifestyle changes, medicines, or both. Lifestyle changes to help manage hypertension include losing weight, eating a healthy, low-sodium diet, exercising more, stopping smoking, and limiting alcohol. This information is not intended to replace advice given to you by your health care provider. Make sure you discuss any questions you have with your health care  provider. Document Revised: 08/06/2021 Document Reviewed: 08/06/2021 Elsevier Patient Education  2023 Elsevier Inc. DASH Eating Plan DASH stands for Dietary Approaches to Stop Hypertension. The DASH eating plan is a healthy eating plan that has been shown to: Reduce high blood pressure (hypertension). Reduce your risk for type 2 diabetes, heart disease, and stroke. Help with weight loss. What are tips for following this plan? Reading food labels Check food labels for the amount of salt (sodium) per serving. Choose foods with less than 5 percent of the Daily Value of sodium. Generally, foods with less than 300 milligrams (mg) of sodium per serving fit into this eating plan. To find whole grains, look for the word "whole" as the first word in the ingredient list. Shopping Buy products labeled as "low-sodium" or "no salt added." Buy fresh foods. Avoid canned foods and pre-made or frozen meals. Cooking Avoid adding salt when cooking. Use salt-free seasonings or herbs instead of table salt or sea salt. Check with your health care provider or pharmacist before using salt substitutes. Do not fry foods. Cook foods using healthy methods such as baking, boiling, grilling, roasting, and broiling instead. Cook with heart-healthy oils, such as olive, canola, avocado, soybean, or sunflower oil. Meal planning  Eat a balanced diet that includes: 4 or more servings of fruits and 4 or more servings of vegetables each day. Try to fill one-half of your plate with fruits and vegetables. 6-8 servings of whole grains each day. Less than 6 oz (170 g) of lean meat, poultry, or fish each day. A 3-oz (85-g) serving of meat is about the same size as a deck of cards. One egg equals 1 oz (28 g). 2-3 servings of low-fat dairy each day. One serving is 1 cup (237 mL). 1 serving of nuts, seeds, or beans 5 times each week. 2-3 servings of heart-healthy fats. Healthy fats called omega-3 fatty acids are found in foods such  as walnuts, flaxseeds, fortified milks, and eggs. These fats are also found in cold-water fish, such as sardines, salmon, and mackerel. Limit how much you eat of: Canned or prepackaged foods. Food that is high in trans fat, such as some fried foods. Food that is high in saturated fat, such as fatty meat. Desserts and other sweets, sugary drinks, and other foods with added sugar. Full-fat dairy products. Do not salt foods before eating. Do not eat more than 4 egg yolks a week. Try to eat at least 2 vegetarian meals a week. Eat more home-cooked food and less restaurant, buffet, and fast food. Lifestyle When eating at a restaurant, ask that your food be prepared with less salt or no salt, if possible. If you drink alcohol: Limit how much you use to: 0-1 drink a day for women who are not pregnant. 0-2 drinks a day for men. Be aware of how much alcohol is in your drink. In the [place] one drink equals one 12 oz bottle of beer (355 mL), one 5 oz glass of wine (148 mL), or one 1 oz glass of hard liquor (44 mL). General   information Avoid eating more than 2,300 mg of salt a day. If you have hypertension, you may need to reduce your sodium intake to 1,500 mg a day. Work with your health care provider to maintain a healthy body weight or to lose weight. Ask what an ideal weight is for you. Get at least 30 minutes of exercise that causes your heart to beat faster (aerobic exercise) most days of the week. Activities may include walking, swimming, or biking. Work with your health care provider or dietitian to adjust your eating plan to your individual calorie needs. What foods should I eat? Fruits All fresh, dried, or frozen fruit. Canned fruit in natural juice (without added sugar). Vegetables Fresh or frozen vegetables (raw, steamed, roasted, or grilled). Low-sodium or reduced-sodium tomato and vegetable juice. Low-sodium or reduced-sodium tomato sauce and tomato paste. Low-sodium or reduced-sodium  canned vegetables. Grains Whole-grain or whole-wheat bread. Whole-grain or whole-wheat pasta. Brown rice. Oatmeal. Quinoa. Bulgur. Whole-grain and low-sodium cereals. Pita bread. Low-fat, low-sodium crackers. Whole-wheat flour tortillas. Meats and other proteins Skinless chicken or turkey. Ground chicken or turkey. Pork with fat trimmed off. Fish and seafood. Egg whites. Dried beans, peas, or lentils. Unsalted nuts, nut butters, and seeds. Unsalted canned beans. Lean cuts of beef with fat trimmed off. Low-sodium, lean precooked or cured meat, such as sausages or meat loaves. Dairy Low-fat (1%) or fat-free (skim) milk. Reduced-fat, low-fat, or fat-free cheeses. Nonfat, low-sodium ricotta or cottage cheese. Low-fat or nonfat yogurt. Low-fat, low-sodium cheese. Fats and oils Soft margarine without trans fats. Vegetable oil. Reduced-fat, low-fat, or light mayonnaise and salad dressings (reduced-sodium). Canola, safflower, olive, avocado, soybean, and sunflower oils. Avocado. Seasonings and condiments Herbs. Spices. Seasoning mixes without salt. Other foods Unsalted popcorn and pretzels. Fat-free sweets. The items listed above may not be a complete list of foods and beverages you can eat. Contact a dietitian for more information. What foods should I avoid? Fruits Canned fruit in a light or heavy syrup. Fried fruit. Fruit in cream or butter sauce. Vegetables Creamed or fried vegetables. Vegetables in a cheese sauce. Regular canned vegetables (not low-sodium or reduced-sodium). Regular canned tomato sauce and paste (not low-sodium or reduced-sodium). Regular tomato and vegetable juice (not low-sodium or reduced-sodium). Pickles. Olives. Grains Baked goods made with fat, such as croissants, muffins, or some breads. Dry pasta or rice meal packs. Meats and other proteins Fatty cuts of meat. Ribs. Fried meat. Bacon. Bologna, salami, and other precooked or cured meats, such as sausages or meat loaves. Fat  from the back of a pig (fatback). Bratwurst. Salted nuts and seeds. Canned beans with added salt. Canned or smoked fish. Whole eggs or egg yolks. Chicken or turkey with skin. Dairy Whole or 2% milk, cream, and half-and-half. Whole or full-fat cream cheese. Whole-fat or sweetened yogurt. Full-fat cheese. Nondairy creamers. Whipped toppings. Processed cheese and cheese spreads. Fats and oils Butter. Stick margarine. Lard. Shortening. Ghee. Bacon fat. Tropical oils, such as coconut, palm kernel, or palm oil. Seasonings and condiments Onion salt, garlic salt, seasoned salt, table salt, and sea salt. Worcestershire sauce. Tartar sauce. Barbecue sauce. Teriyaki sauce. Soy sauce, including reduced-sodium. Steak sauce. Canned and packaged gravies. Fish sauce. Oyster sauce. Cocktail sauce. Store-bought horseradish. Ketchup. Mustard. Meat flavorings and tenderizers. Bouillon cubes. Hot sauces. Pre-made or packaged marinades. Pre-made or packaged taco seasonings. Relishes. Regular salad dressings. Other foods Salted popcorn and pretzels. The items listed above may not be a complete list of foods and beverages you should avoid. Contact a dietitian   for more information. Where to find more information National Heart, Lung, and Blood Institute: www.nhlbi.nih.gov American Heart Association: www.heart.org Academy of Nutrition and Dietetics: www.eatright.org National Kidney Foundation: www.kidney.org Summary The DASH eating plan is a healthy eating plan that has been shown to reduce high blood pressure (hypertension). It may also reduce your risk for type 2 diabetes, heart disease, and stroke. When on the DASH eating plan, aim to eat more fresh fruits and vegetables, whole grains, lean proteins, low-fat dairy, and heart-healthy fats. With the DASH eating plan, you should limit salt (sodium) intake to 2,300 mg a day. If you have hypertension, you may need to reduce your sodium intake to 1,500 mg a day. Work with  your health care provider or dietitian to adjust your eating plan to your individual calorie needs. This information is not intended to replace advice given to you by your health care provider. Make sure you discuss any questions you have with your health care provider. Document Revised: 10/26/2019 Document Reviewed: 10/26/2019 Elsevier Patient Education  2023 Elsevier Inc.  

## 2022-04-28 NOTE — Progress Notes (Signed)
[facility] [address] [place] [address] Phone: [phone] Subjective:   Bruce Donath, am serving as a scribe for Dr. Antoine Primas.   I'm seeing this patient by the request  of:  Pcp, No  CC: Neck and left shoulder pain  [id number]  Me[facility] is a 41 y.o. female coming in with complaint of left shoulder and neck pain. Patient states that she is having pain in L trap and shoulder after shoveling manure into a pickup. Pain radiates into L arm. Pain began last week of February. Has injection at [facility] last Friday. Is in PT currently.  Reviewed patient's chart and when she was at urgent care was referred cyclobenzaprine but patient has not picked it up yet.  Continues to have chronic pain mostly on the left side.  Difficulty with moving her neck.  The symptoms can make her nauseous.     Past Medical History:  Diagnosis Date   Allergy    Anxiety    Asthma    states only when stressed out   Depression    History of bronchitis    in college   Neck pain    ruptured disc in neck   URI (upper respiratory infection)    early Nov 2014   Weakness    numbness in left arm   Past Surgical History:  Procedure Laterality Date   WISDOM TOOTH EXTRACTION     Social History   Socioeconomic History   Marital status: Single    Spouse name: Not on file   Number of children: Not on file   Years of education: Not on file   Highest education level: Not on file  Occupational History   Not on file  Tobacco Use   Smoking status: Every Day    Packs/day: 1.00    Years: 10.00    Pack years: 10.00    Types: Cigarettes   Smokeless tobacco: Never  Vaping Use   Vaping Use: Never used  Substance and Sexual Activity   Alcohol use: Yes    Comment: wine 2-3 times a week   Drug use: Yes    Types: Marijuana   Sexual activity: Yes    Birth control/protection: None  Other Topics Concern   Not on file  Social History Narrative   Engaged.   College  degree, bachelors. Works in [employer].   Occasional alcohol. Every day smoker. No drugs.   Drink caffeinated beverages.   Wears her seatbelt and bicycle helmet.   Smoke detector at home.   Social Determinants of Health   Financial Resource Strain: Not on file  Food Insecurity: Not on file  Transportation Needs: Not on file  Physical Activity: Not on file  Stress: Not on file  Social Connections: Not on file   Allergies  Allergen Reactions   Other Shortness Of Breath and Itching    Pecans, tree nuts    Benadryl [Diphenhydramine] Other (See Comments)    hyperactivity   Family History  Problem Relation Age of Onset   Heart disease Mother    Hypertension Mother    Heart attack Mother 82   Early death Father    Cancer Father 45       stomach cancer   Stroke Father    Heart attack Father        After stroke   Breast cancer Paternal Grandmother 73     Current Outpatient Medications (Cardiovascular):    metoprolol tartrate (LOPRESSOR)  25 MG tablet, TAKE 1 TABLET BY MOUTH TWICE A DAY   THALITONE 15 MG tablet, TAKE 1 TABLET BY MOUTH DAILY.  Current Outpatient Medications (Respiratory):    ALBUTEROL IN, Inhale into the lungs.   loratadine (CLARITIN) 10 MG tablet, Take 10 mg by mouth daily as needed for allergies.    promethazine (PHENERGAN) 25 MG suppository, Place 1 suppository (25 mg total) rectally every 8 (eight) hours as needed for nausea or vomiting. (Patient taking differently: Place 25 mg rectally as needed for nausea or vomiting.)    Current Outpatient Medications (Other):    clobetasol cream (TEMOVATE) 0.05 %, Apply 1 application topically 2 (two) times daily.   cyclobenzaprine (FLEXERIL) 10 MG tablet, Take 1 tablet (10 mg total) by mouth at bedtime as needed for muscle spasms. Do not drink alcohol or drive while taking this medication.  May cause drowsiness.   FLUoxetine (PROZAC) 20 MG capsule, Take 60 mg by mouth every morning.   LORazepam (ATIVAN)  0.5 MG tablet, Take 1 tablet (0.5 mg total) by mouth 2 (two) times daily as needed for anxiety. ADDITIONAL REFILLS FROM PSYCHIATRY.   omeprazole (PRILOSEC) 40 MG capsule, TAKE 1 CAPSULE BY MOUTH EVERY DAY   ondansetron (ZOFRAN ODT) 4 MG disintegrating tablet, Take 1 tablet (4 mg total) by mouth every 8 (eight) hours as needed for nausea or vomiting. (Patient taking differently: Take by mouth as needed for nausea or vomiting.)   ondansetron (ZOFRAN) 4 MG tablet, Take one tablet by mouth once daily , as needed, for nausea associated with panic and anxiety   Vitamin D, Ergocalciferol, (DRISDOL) 1.25 MG (50000 UNIT) CAPS capsule, Take 1 capsule (50,000 Units total) by mouth every 7 (seven) days.   potassium chloride SA (KLOR-CON) 20 MEQ tablet, Take 1 tablet (20 mEq total) by mouth 2 (two) times daily for 5 days.   Reviewed prior external information including notes and imaging from  primary care provider As well as notes that were available from care everywhere and other healthcare systems.  Past medical history, social, surgical and family history all reviewed in electronic medical record.  No pertanent information unless stated regarding to the chief complaint.   Review of Systems:  No  visual changes, , vomiting, diarrhea, constipation, dizziness, abdominal pain, skin rash, fevers, chills, night sweats, weight loss, swollen lymph nodes, body aches, joint swelling, chest pain, shortness of breath, mood changes. POSITIVE muscle aches, body aches, nausea, headache  Objective  Blood pressure 124/84, pulse 87, height 5\' 4"  (1.626 m), last menstrual period 04/20/2022, SpO2 98 %.   General: No apparent distress alert and oriented x3 mood and affect normal, dressed appropriately.  HEENT: Pupils equal, extraocular movements intact  Respiratory: Patient's speak in full sentences and does not appear short of breath  Cardiovascular: No lower extremity edema, non tender, no erythema  Gait normal with good  balance and coordination.  MSK: Neck exam is having significant loss of lordosis.  Severe tightness noted in the left side of the shoulder region.  Patient has relatively good range of motion but does have mild positive impingement of the shoulder.  Patient has multiple trigger points including in the trapezius, rhomboid, levator scapula.  Patient has limited side bending of the neck to the right and left-sided rotation.  Negative Spurling's.  5-5 strength of the upper extremities.  Lacks last 5 degrees of extension of the neck  Limited muscular skeletal ultrasound was performed and interpreted by Antoine PrimasSMITH, Jailen Coward, M  Limited ultrasound of patient's  left shoulder shows very mild hypoechoic changes noted of the supraspinatus tendon sheath but no significant tearing noted.  Some mild degenerative and calcific changes that likely are secondary to a old injury of the lateral humerus.  After verbal consent patient was prepped with alcohol swab and with a 25-gauge half inch needle injected into 4 distinct trigger points in the left shoulder region including the trapezius, levator scapula, and rhomboid muscle.  Total of 3 cc of 0.5% Marcaine and 1 cc of Kenalog 40 mg/mL used no blood loss.  Postinjection instructions given.  Band-Aid placed  [id number]; 15 additional minutes spent for Therapeutic exercises as stated in above notes.  This included exercises focusing on stretching, strengthening, with significant focus on eccentric aspects.   Long term goals include an improvement in range of motion, strength, endurance as well as avoiding reinjury. Patient's frequency would include in 1-2 times a day, 3-5 times a week for a duration of 6-12 weeks.  Shoulder Exercises that included:  Basic scapular stabilization to include adduction and depression of scapula Scaption, focusing on proper movement and good control Internal and External rotation utilizing a theraband, with elbow tucked at side entire time Rows with  theraband  Proper technique shown and discussed handout in great detail with ATC.  All questions were discussed and answered.      Impression and Recommendations:    The above documentation has been reviewed and is accurate and complete Judi Saa, DO

## 2022-04-29 ENCOUNTER — Ambulatory Visit (INDEPENDENT_AMBULATORY_CARE_PROVIDER_SITE_OTHER): Payer: Commercial Managed Care - HMO

## 2022-04-29 ENCOUNTER — Ambulatory Visit (INDEPENDENT_AMBULATORY_CARE_PROVIDER_SITE_OTHER): Payer: Commercial Managed Care - HMO | Admitting: Family Medicine

## 2022-04-29 ENCOUNTER — Ambulatory Visit: Payer: Self-pay

## 2022-04-29 ENCOUNTER — Other Ambulatory Visit: Payer: Self-pay | Admitting: Family

## 2022-04-29 VITALS — BP 124/84 | HR 87 | Ht 64.0 in

## 2022-04-29 DIAGNOSIS — M25512 Pain in left shoulder: Secondary | ICD-10-CM

## 2022-04-29 DIAGNOSIS — M542 Cervicalgia: Secondary | ICD-10-CM

## 2022-04-29 NOTE — Patient Instructions (Signed)
Xray neck and shoulder Vit D 2000IU daily See me again in 8 weeks

## 2022-04-30 DIAGNOSIS — M542 Cervicalgia: Secondary | ICD-10-CM | POA: Insufficient documentation

## 2022-04-30 DIAGNOSIS — M25512 Pain in left shoulder: Secondary | ICD-10-CM | POA: Insufficient documentation

## 2022-04-30 NOTE — Assessment & Plan Note (Signed)
Injections given and tolerated the procedure well.  Could be secondary now to cervical radiculopathy and we will monitor.  May need to consider the possibility of gabapentin which patient did not want to do at this moment.  Patient given home exercises and work with Event organiser.  X-rays are pending at the moment.  Patient knows if worsening symptoms to seek medical attention.  If any radicular symptoms with weakness of the upper extremity will need to consider the possibility of advanced imaging.

## 2022-04-30 NOTE — Assessment & Plan Note (Signed)
Seems to be multifactorial.  We will get x-rays to further evaluate the cervical spine as well as the left shoulder.  Discussed with patient about icing regimen at home exercises otherwise.  Discussed keeping hands within the peripheral vision.  We given trigger point injections and hopefully this will be helpful as well.  Patient wants to avoid significant number of medications but does have Flexeril prescribed to her if needed at night.  Follow-up with me again in 6 to 8 weeks otherwise.

## 2022-05-05 ENCOUNTER — Other Ambulatory Visit: Payer: Self-pay | Admitting: Family

## 2022-05-10 NOTE — Telephone Encounter (Signed)
Pt unsure on the dosage and frequency of  hydrochlorothiazide (HYDRODIURIL) 25 MG tablet   . per pharmacy, insurance would not without dosage and frequency. Pls advise patient and pharmacy

## 2022-05-11 ENCOUNTER — Telehealth: Payer: Commercial Managed Care - HMO | Admitting: Physician Assistant

## 2022-05-11 DIAGNOSIS — L255 Unspecified contact dermatitis due to plants, except food: Secondary | ICD-10-CM | POA: Diagnosis not present

## 2022-05-11 MED ORDER — CLOBETASOL PROPIONATE 0.05 % EX CREA: 1.0000 "application " | TOPICAL_CREAM | Freq: Two times a day (BID) | CUTANEOUS | 0 refills | Status: DC

## 2022-05-11 MED ORDER — TRIAMCINOLONE ACETONIDE 0.1 % EX CREA: 1.0000 "application " | TOPICAL_CREAM | Freq: Two times a day (BID) | CUTANEOUS | 0 refills | Status: DC

## 2022-05-11 MED ORDER — PREDNISONE 10 MG PO TABS: ORAL_TABLET | ORAL | 0 refills | Status: AC

## 2022-05-11 NOTE — Progress Notes (Signed)
I have spent 5 minutes in review of e-visit questionnaire, review and updating patient chart, medical decision making and response to patient.   Torren Maffeo Cody Kynlie Jane, PA-C    

## 2022-05-11 NOTE — Addendum Note (Signed)
Addended by: Brunetta Jeans on: 05/11/2022 06:29 PM   Modules accepted: Orders

## 2022-05-11 NOTE — Progress Notes (Signed)
E-Visit for [employer]  We are sorry that you are not feeing well.  Here is how we plan to help!  Based on what you have shared with me it looks like you have had an allergic reaction to the oily resin from a group of plants.  This resin is very sticky, so it easily attaches to your skin, clothing, tools equipment, and pet's fur.    This blistering rash is often called poison ivy rash although it can come from contact with the leaves, stems and roots of poison ivy, poison oak and poison sumac.  The oily resin contains urushiol (u-ROO-she-ol) that produces a skin rash on exposed skin.  The severity of the rash depends on the amount of urushiol that gets on your skin.  A section of skin with more urushiol on it may develop a rash sooner.  The rash usually develops 12-48 hours after exposure and can last two to three weeks.  Your skin must come in direct contact with the plant's oil to be affected.  Blister fluid doesn't spread the rash.  However, if you come into contact with a piece of clothing or pet fur that has urushiol on it, the rash may spread out.  You can also transfer the oil to other parts of your body with your fingers.  Often the rash looks like a straight line because of the way the plant brushes against your skin.  Since your rash is widespread or has resulted in a large number of blisters, I have prescribed an oral corticosteroid.  Please follow these recommendations:  I have sent a prednisone dose pack to your chosen pharmacy. Be sure to follow the instructions carefully and complete the entire prescription. You may use Benadryl or Caladryl topical lotions to sooth the itch and remember cool, not hot, showers and baths can help relieve the itching!  Place cool, wet compresses on the affected area for 15-30 minutes several times a day.  You may also take oral antihistamines, such as diphenhydramine (Benadryl, others), which may also help you sleep better.  Watch your skin for any purulent  (pus) drainage or red streaking from the site.  If this occurs, contact your provider.  You may require an antibiotic for a skin infection.  Make sure that the clothes you were wearing as well as any towels or sheets that may have come in contact with the oil (urushiol) are washed in detergent and hot water.       I have developed the following plan to treat your condition I am prescribing a two week course of steroids (37 tablets of 10 mg prednisone).  Days 1-4 take 4 tablets (40 mg) daily  Days 5-8 take 3 tablets (30 mg) daily, Days 9-11 take 2 tablets (20 mg) daily, Days 12-14 take 1 tablet (10 mg) daily.  I have also sent in a topical steroid cream, Kenalog, to apply twice daily to areas excluding those on face and groin.    What can you do to prevent this rash?  Avoid the plants.  Learn how to identify poison ivy, poison oak and poison sumac in all seasons.  When hiking or engaging in other activities that might expose you to these plants, try to stay on cleared pathways.  If camping, make sure you pitch your tent in an area free of these plants.  Keep pets from running through wooded areas so that urushiol doesn't accidentally stick to their fur, which you may touch.  Remove or  kill the plants.  In your yard, you can get rid of poison ivy by applying an herbicide or pulling it out of the ground, including the roots, while wearing heavy gloves.  Afterward remove the gloves and thoroughly wash them and your hands.  Don't burn poison ivy or related plants because the urushiol can be carried by smoke.  Wear protective clothing.  If needed, protect your skin by wearing socks, boots, pants, long sleeves and vinyl gloves.  Wash your skin right away.  Washing off the oil with soap and water within 30 minutes of exposure may reduce your chances of getting a poison ivy rash.  Even washing after an hour or so can help reduce the severity of the rash.  If you walk through some poison ivy and then later touch  your shoes, you may get some urushiol on your hands, which may then transfer to your face or body by touching or rubbing.  If the contaminated object isn't cleaned, the urushiol on it can still cause a skin reaction years later.    Be careful not to reuse towels after you have washed your skin.  Also carefully wash clothing in detergent and hot water to remove all traces of the oil.  Handle contaminated clothing carefully so you don't transfer the urushiol to yourself, furniture, rugs or appliances.  Remember that pets can carry the oil on their fur and paws.  If you think your pet may be contaminated with urushiol, put on some long rubber gloves and give your pet a bath.  Finally, be careful not to burn these plants as the smoke can contain traces of the oil.  Inhaling the smoke may result in difficulty breathing. If that occurred you should see a physician as soon as possible.  See your doctor right away if:  The reaction is severe or widespread You inhaled the smoke from burning poison ivy and are having difficulty breathing Your skin continues to swell The rash affects your eyes, mouth or genitals Blisters are oozing pus You develop a fever greater than 100 F (37.8 C) The rash doesn't get better within a few weeks.  If you scratch the poison ivy rash, bacteria under your fingernails may cause the skin to become infected.  See your doctor if pus starts oozing from the blisters.  Treatment generally includes antibiotics.  Poison ivy treatments are usually limited to self-care methods.  And the rash typically goes away on its own in two to three weeks.     If the rash is widespread or results in a large number of blisters, your doctor may prescribe an oral corticosteroid, such as prednisone.  If a bacterial infection has developed at the rash site, your doctor may give you a prescription for an oral antibiotic.  MAKE SURE YOU  Understand these instructions. Will watch your  condition. Will get help right away if you are not doing well or get worse.   Thank you for choosing an e-visit.  Your e-visit answers were reviewed by a board certified advanced clinical practitioner to complete your personal care plan. Depending upon the condition, your plan could have included both over the counter or prescription medications.  Please review your pharmacy choice. Make sure the pharmacy is open so you can pick up prescription now. If there is a problem, you may contact your provider through [employer] and have the prescription routed to another pharmacy.  Your safety is important to [place]. If you have drug allergies check  your prescription carefully.   For the next 24 hours you can use MyChart to ask questions about today's visit, request a non-urgent call back, or ask for a work or school excuse. You will get an email in the next two days asking about your experience. I hope that your e-visit has been valuable and will speed your recovery.

## 2022-05-21 ENCOUNTER — Ambulatory Visit: Payer: Commercial Managed Care - HMO | Admitting: Family

## 2022-06-10 ENCOUNTER — Other Ambulatory Visit: Payer: Self-pay | Admitting: Family

## 2022-06-23 NOTE — Progress Notes (Deleted)
[name] [address] [place] [address] Phone: [phone] Subjective:    I'm seeing this patient by the request  of:  Pcp, No  CC:   [id number]  04/29/2022 Injections given and tolerated the procedure well.  Could be secondary now to cervical radiculopathy and we will monitor.  May need to consider the possibility of gabapentin which patient did not want to do at this moment.  Patient given home exercises and work with Event organiser.  X-rays are pending at the moment.  Patient knows if worsening symptoms to seek medical attention.  If any radicular symptoms with weakness of the upper extremity will need to consider the possibility of advanced imaging.  Seems to be multifactorial.  We will get x-rays to further evaluate the cervical spine as well as the left shoulder.  Discussed with patient about icing regimen at home exercises otherwise.  Discussed keeping hands within the peripheral vision.  We given trigger point injections and hopefully this will be helpful as well.  Patient wants to avoid significant number of medications but does have Flexeril prescribed to her if needed at night.  Follow-up with me again in 6 to 8 weeks otherwise.  Updated 06/24/2022 Meagan Mason is a 42 y.o. female coming in with complaint of shoulder and neck pain  Xrays IMPRESSION: Degenerative changes at C4-5 and C5-6. No other significant abnormalities.  Shoulder xray (-)     Past Medical History:  Diagnosis Date   Allergy    Anxiety    Asthma    states only when stressed out   Depression    History of bronchitis    in college   Neck pain    ruptured disc in neck   URI (upper respiratory infection)    early Nov 2014   Weakness    numbness in left arm   Past Surgical History:  Procedure Laterality Date   WISDOM TOOTH EXTRACTION     Social History   Socioeconomic History   Marital status: Single    Spouse name: Not on file   Number of  children: Not on file   Years of education: Not on file   Highest education level: Not on file  Occupational History   Not on file  Tobacco Use   Smoking status: Every Day    Packs/day: 1.00    Years: 10.00    Total pack years: 10.00    Types: Cigarettes   Smokeless tobacco: Never  Vaping Use   Vaping Use: Never used  Substance and Sexual Activity   Alcohol use: Yes    Comment: wine 2-3 times a week   Drug use: Yes    Types: Marijuana   Sexual activity: Yes    Birth control/protection: None  Other Topics Concern   Not on file  Social History Narrative   Engaged.   College degree, bachelors. Works in Lexicographer.   Occasional alcohol. Every day smoker. No drugs.   Drink caffeinated beverages.   Wears her seatbelt and bicycle helmet.   Smoke detector at home.   Social Determinants of Health   Financial Resource Strain: Not on file  Food Insecurity: Not on file  Transportation Needs: Not on file  Physical Activity: Not on file  Stress: Not on file  Social Connections: Not on file   Allergies  Allergen Reactions   Other Shortness Of Breath and Itching    Pecans, tree nuts    Benadryl [Diphenhydramine] Other (See Comments)  hyperactivity   Family History  Problem Relation Age of Onset   Heart disease Mother    Hypertension Mother    Heart attack Mother 38   Early death Father    Cancer Father 50       stomach cancer   Stroke Father    Heart attack Father        After stroke   Breast cancer Paternal Grandmother 50     Current Outpatient Medications (Cardiovascular):    hydrochlorothiazide (HYDRODIURIL) 25 MG tablet, PLEASE SPECIFY DIRECTIONS, REFILLS AND QUANTITY   metoprolol tartrate (LOPRESSOR) 25 MG tablet, TAKE 1 TABLET BY MOUTH TWICE A DAY  Current Outpatient Medications (Respiratory):    ALBUTEROL IN, Inhale into the lungs.   loratadine (CLARITIN) 10 MG tablet, Take 10 mg by mouth daily as needed for allergies.    promethazine  (PHENERGAN) 25 MG suppository, Place 1 suppository (25 mg total) rectally every 8 (eight) hours as needed for nausea or vomiting. (Patient taking differently: Place 25 mg rectally as needed for nausea or vomiting.)    Current Outpatient Medications (Other):    clobetasol cream (TEMOVATE) 0.05 %, Apply 1 application. topically 2 (two) times daily.   cyclobenzaprine (FLEXERIL) 10 MG tablet, Take 1 tablet (10 mg total) by mouth at bedtime as needed for muscle spasms. Do not drink alcohol or drive while taking this medication.  May cause drowsiness.   FLUoxetine (PROZAC) 20 MG capsule, Take 60 mg by mouth every morning.   LORazepam (ATIVAN) 0.5 MG tablet, Take 1 tablet (0.5 mg total) by mouth 2 (two) times daily as needed for anxiety. ADDITIONAL REFILLS FROM PSYCHIATRY.   omeprazole (PRILOSEC) 40 MG capsule, TAKE 1 CAPSULE BY MOUTH EVERY DAY   ondansetron (ZOFRAN ODT) 4 MG disintegrating tablet, Take 1 tablet (4 mg total) by mouth every 8 (eight) hours as needed for nausea or vomiting. (Patient taking differently: Take by mouth as needed for nausea or vomiting.)   ondansetron (ZOFRAN) 4 MG tablet, Take one tablet by mouth once daily , as needed, for nausea associated with panic and anxiety   potassium chloride SA (KLOR-CON) 20 MEQ tablet, Take 1 tablet (20 mEq total) by mouth 2 (two) times daily for 5 days.   Vitamin D, Ergocalciferol, (DRISDOL) 1.25 MG (50000 UNIT) CAPS capsule, Take 1 capsule (50,000 Units total) by mouth every 7 (seven) days.   Reviewed prior external information including notes and imaging from  primary care provider As well as notes that were available from care everywhere and other healthcare systems.  Past medical history, social, surgical and family history all reviewed in electronic medical record.  No pertanent information unless stated regarding to the chief complaint.   Review of Systems:  No headache, visual changes, nausea, vomiting, diarrhea, constipation,  dizziness, abdominal pain, skin rash, fevers, chills, night sweats, weight loss, swollen lymph nodes, body aches, joint swelling, chest pain, shortness of breath, mood changes. POSITIVE muscle aches  Objective  There were no vitals taken for this visit.   General: No apparent distress alert and oriented x3 mood and affect normal, dressed appropriately.  HEENT: Pupils equal, extraocular movements intact  Respiratory: Patient's speak in full sentences and does not appear short of breath  Cardiovascular: No lower extremity edema, non tender, no erythema      Impression and Recommendations:

## 2022-06-24 ENCOUNTER — Ambulatory Visit: Payer: Commercial Managed Care - HMO | Admitting: Family Medicine

## 2022-07-14 ENCOUNTER — Encounter: Payer: Self-pay | Admitting: Emergency Medicine

## 2022-07-14 ENCOUNTER — Encounter: Payer: Self-pay | Admitting: Family Medicine

## 2022-07-14 ENCOUNTER — Ambulatory Visit
Admission: EM | Admit: 2022-07-14 | Discharge: 2022-07-14 | Disposition: A | Discharge status: Home / Self Care | Payer: Commercial Managed Care - HMO | Attending: Family Medicine | Admitting: Family Medicine

## 2022-07-14 DIAGNOSIS — S91311A Laceration without foreign body, right foot, initial encounter: Secondary | ICD-10-CM | POA: Diagnosis not present

## 2022-07-14 MED ORDER — AMOXICILLIN-POT CLAVULANATE 875-125 MG PO TABS: 1.0000 | ORAL_TABLET | Freq: Two times a day (BID) | ORAL | 0 refills | Status: DC

## 2022-07-14 MED ORDER — TETANUS-DIPHTH-ACELL PERTUSSIS 5-2.5-18.5 LF-MCG/0.5 IM SUSY: 0.5000 mL | PREFILLED_SYRINGE | Freq: Once | INTRAMUSCULAR | Status: DC

## 2022-07-14 NOTE — ED Triage Notes (Addendum)
Pt presents with laceration on right foot from shovel around 9-10 pm.

## 2022-07-14 NOTE — ED Provider Notes (Signed)
[facility]   [id number] 07/14/22 Arrival Time: 8828  ASSESSMENT & PLAN:  1. Foot laceration, right, initial encounter    Meds ordered this encounter  Medications   DISCONTD: Tdap (BOOSTRIX) injection 0.5 mL   amoxicillin-clavulanate (AUGMENTIN) 875-125 MG tablet    Sig: Take 1 tablet by mouth every 12 (twelve) hours.    Dispense:  14 tablet    Refill:  0   Immunization History  Administered Date(s) Administered   Tdap 02/06/2016, 08/11/2021   Placed in post-op shoe for comfort.  Procedure: Laceration Repair Verbal consent obtained. Patient provided with risks and alternatives to the procedure. Wound copiously irrigated with NS then cleansed with betadine. Local anesthesia: Lidocaine 2% without epinephrine. Wound carefully explored. No foreign body, tendon injury, or nonviable tissue were noted. Using sterile technique, 5 interrupted 3-0 Prolene sutures were placed to reapproximate the wound. Procedure tolerated well. No complications. Minimal bleeding. Advised to look for and return for any signs of infection such as redness, swelling, discharge, or worsening pain. Return for suture removal in 7-10 days.  Reviewed expectations re: course of current medical issues. Questions answered. Outlined signs and symptoms indicating need for more acute intervention. Patient verbalized understanding. After Visit Summary given.   SUBJECTIVE:  Meagan Mason is a 41 y.o. female who presents with a laceration of her RIGHT distal foot; approx 11 hours ago; washed under running water; reports applying H2O2 to wound also. Mild bleeding; controlled. No extremity sensation changes or weakness. Mild pain today. Able to br wt without difficulty.  OBJECTIVE:  Vitals:   07/14/22 0921  BP: (!) 166/106  Pulse: (!) 120  Resp: 17  Temp: 98.2 F (36.8 C)  TempSrc: Oral  SpO2: 98%    Recheck P 108 and regular; is very nervous.  General appearance: alert; no distress Skin: linear  laceration of RIGHT dorsal distal foot; size: approx 3 cm; clean wound edges, no foreign bodies; without active bleeding; all toes with FROM, normal distal sensation, normal cap refill Psychological: alert and cooperative; normal mood and affect  Allergies  Allergen Reactions   Other Shortness Of Breath and Itching    Pecans, tree nuts    Benadryl [Diphenhydramine] Other (See Comments)    hyperactivity    Past Medical History:  Diagnosis Date   Allergy    Anxiety    Asthma    states only when stressed out   Depression    History of bronchitis    in college   Neck pain    ruptured disc in neck   URI (upper respiratory infection)    early Nov 2014   Weakness    numbness in left arm   Social History   Socioeconomic History   Marital status: Single    Spouse name: Not on file   Number of children: Not on file   Years of education: Not on file   Highest education level: Not on file  Occupational History   Not on file  Tobacco Use   Smoking status: Every Day    Packs/day: 1.00    Years: 10.00    Total pack years: 10.00    Types: Cigarettes   Smokeless tobacco: Never  Vaping Use   Vaping Use: Never used  Substance and Sexual Activity   Alcohol use: Yes    Comment: wine 2-3 times a week   Drug use: Yes    Types: Marijuana   Sexual activity: Yes    Birth control/protection: None  Other Topics Concern  Not on file  Social History Narrative   Engaged.   College degree, bachelors. Works in Lexicographer.   Occasional alcohol. Every day smoker. No drugs.   Drink caffeinated beverages.   Wears her seatbelt and bicycle helmet.   Smoke detector at home.   Social Determinants of Health   Financial Resource Strain: Not on file  Food Insecurity: Not on file  Transportation Needs: Not on file  Physical Activity: Not on file  Stress: Not on file  Social Connections: Not on file          Mardella Layman, MD 07/14/22 1051

## 2022-07-19 ENCOUNTER — Telehealth: Payer: Self-pay

## 2022-07-19 NOTE — Telephone Encounter (Addendum)
Pt is requesting a refill of the  LORazepam (ATIVAN) 0.5 MG tablet  Pt understands she has a "New Patient Establishment" visit in October 2023, but states she is "desperate and is about to lose her mind"  Last OV: 04/27/2022  Please advise.   [facility] [phone] - , [place] - [address] AT [facility] [address] Phone:  [phone]  Fax:  [phone]

## 2022-07-23 ENCOUNTER — Ambulatory Visit
Admission: RE | Admit: 2022-07-23 | Discharge: 2022-07-23 | Disposition: A | Discharge status: Home / Self Care | Payer: Commercial Managed Care - HMO | Source: Ambulatory Visit

## 2022-07-23 NOTE — Telephone Encounter (Signed)
This provider unable to provide Ativan, as this pt is not established with this office.  Per chart review pt is established with [facility], though had an acute visit here on 04/27/2022.  Per further review, it appears that pt has been obtaining Ativan from several area providers over the last few months.  Given this please advise pt that she would likely benefit from finding someone else to est care with as this provider, unfortunately will be unable to accept her.

## 2022-07-23 NOTE — ED Notes (Signed)
Patient came in today to have sutures removed from right foot, tolerated well and all sutures removed.

## 2022-07-30 NOTE — Telephone Encounter (Signed)
Pt notified that since she does not have a PCP in this office, medications cannot be refilled. Pt verb understanding.  Also advised that Dr Salomon Fick is unable to accept her as a new patient. Rescheduled appt to 08/17/22 with Dr Casimiro Needle.

## 2022-08-17 ENCOUNTER — Encounter: Payer: Self-pay | Admitting: Family Medicine

## 2022-08-17 ENCOUNTER — Ambulatory Visit (INDEPENDENT_AMBULATORY_CARE_PROVIDER_SITE_OTHER): Payer: Commercial Managed Care - HMO | Admitting: Family Medicine

## 2022-08-17 VITALS — BP 152/82 | HR 122 | Temp 98.3°F | Ht 64.0 in | Wt 142.4 lb

## 2022-08-17 DIAGNOSIS — F411 Generalized anxiety disorder: Secondary | ICD-10-CM

## 2022-08-17 DIAGNOSIS — I10 Essential (primary) hypertension: Secondary | ICD-10-CM

## 2022-08-17 DIAGNOSIS — E559 Vitamin D deficiency, unspecified: Secondary | ICD-10-CM

## 2022-08-17 MED ORDER — CHLORTHALIDONE 25 MG PO TABS: 25.0000 mg | ORAL_TABLET | Freq: Every day | ORAL | 1 refills | Status: DC

## 2022-08-17 MED ORDER — VITAMIN D3 50 MCG (2000 UT) PO CAPS: 2000.0000 [IU] | ORAL_CAPSULE | Freq: Every day | ORAL | 3 refills | Status: DC

## 2022-08-17 MED ORDER — LAMOTRIGINE ER 25 MG PO TB24: ORAL_TABLET | ORAL | 0 refills | Status: DC

## 2022-08-17 NOTE — Assessment & Plan Note (Addendum)
On prozac and PRN ativan, reports she still has great difficulty with her anxiety symptoms.I advised that she should not use the Ativan regularly due to the high risk nature of the medication, causing potential withdrawal symptoms and dependency. We discussed that the use of mood stabilization may be appropriate. Patient is willing to try lamotrigine to help with her symptoms. Will start 25 mg and titrate up to 50 mg BID. I will see her back in 5 weeks for a video visit to reassess her symptoms.

## 2022-08-17 NOTE — Progress Notes (Signed)
Established Patient Office Visit  Subjective   Patient ID: Meagan Mason, female    DOB: 1981/10/16  Age: 41 y.o. MRN: [id number]  Chief Complaint  Patient presents with   Establish Care    Patient is here for transition of care visit. Patient reports that she has a history of HTN, states that she was prescribed a BP medication in May however her insurance would not cover the dosage that was prescribed. We discussed using a higher dose of medication and she is agreeable. She denies any headaches, dizziness or chest pain.   Anxiety/Depression - pt reports that her mood symptoms are stable on the prozac. States that her mood is generally good, however she always has a feeling of "impending doom" she reports that she gets very anxious and she has difficulty with extrnal stressors. States that she tried to go on vacation with her family but she almost could not go. We had a long discussion about options for treatment, I did not advise continued use of ativan due to the risk of dependency and tolerance.    Current Outpatient Medications  Medication Instructions   ALBUTEROL IN Inhalation   chlorthalidone (HYGROTON) 25 mg, Oral, Daily   clobetasol cream (TEMOVATE) 6.27 % 1 application , Topical, 2 times daily   FLUoxetine (PROZAC) 40 mg, Oral, Daily   loratadine (CLARITIN) 10 mg, Oral, Daily PRN   LORazepam (ATIVAN) 0.5 mg, Oral, 2 times daily PRN, ADDITIONAL REFILLS FROM PSYCHIATRY.   omeprazole (PRILOSEC) 40 MG capsule TAKE 1 CAPSULE BY MOUTH EVERY DAY   ondansetron (ZOFRAN ODT) 4 mg, Oral, Every 8 hours PRN   ondansetron (ZOFRAN) 4 MG tablet Take one tablet by mouth once daily , as needed, for nausea associated with panic and anxiety   promethazine (PHENERGAN) 25 mg, Rectal, Every 8 hours PRN   Vitamin D3 2,000 Units, Oral, Daily     Patient Active Problem List   Diagnosis Date Noted   Vitamin D deficiency 08/19/2022   Neck pain 04/30/2022   Trigger point of left shoulder region  04/30/2022   Nausea 03/50/0938   Cyclical vomiting 18/29/9371   Essential hypertension 09/23/2021   GAD (generalized anxiety disorder) 04/24/2019   Gastritis 02/06/2016   Tobacco abuse 02/06/2016   Encounter to establish care 02/06/2016      Review of Systems  All other systems reviewed and are negative.     Objective:     BP (!) 152/82 (BP Location: Right Arm, Cuff Size: Normal)   Pulse (!) 122   Temp 98.3 F (36.8 C) (Oral)   Ht '5\' 4"'  (1.626 m)   Wt 142 lb 6.4 oz (64.6 kg)   LMP 08/13/2022 (Approximate)   SpO2 98%   BMI 24.44 kg/m  BP Readings from Last 3 Encounters:  08/17/22 (!) 152/82  07/14/22 (!) 166/106  04/29/22 124/84      Physical Exam Vitals reviewed.  Constitutional:      Appearance: Normal appearance. She is well-groomed and normal weight.  Eyes:     Extraocular Movements: Extraocular movements intact.     Conjunctiva/sclera: Conjunctivae normal.  Cardiovascular:     Rate and Rhythm: Normal rate and regular rhythm.     Pulses: Normal pulses.     Heart sounds: S1 normal and S2 normal.  Pulmonary:     Effort: Pulmonary effort is normal.     Breath sounds: Normal breath sounds and air entry.  Abdominal:     General: Abdomen is flat. Bowel sounds are normal.  Musculoskeletal:     Cervical back: Normal range of motion and neck supple.  Neurological:     Mental Status: She is alert and oriented to person, place, and time. Mental status is at baseline.     Gait: Gait is intact.  Psychiatric:        Mood and Affect: Affect normal. Mood is anxious.        Speech: Speech normal.        Behavior: Behavior normal.        Thought Content: Thought content normal.        Judgment: Judgment normal.      No results found for any visits on 08/17/22.  Last metabolic panel Lab Results  Component Value Date   GLUCOSE 86 04/27/2022   NA 133 (L) 04/27/2022   K 4.5 04/27/2022   CL 99 04/27/2022   CO2 27 04/27/2022   BUN 8 04/27/2022   CREATININE  0.55 04/27/2022   EGFR 116 09/23/2021   CALCIUM 9.4 04/27/2022   PROT 7.5 04/27/2022   ALBUMIN 4.2 04/27/2022   LABGLOB 3.1 09/23/2021   AGRATIO 1.5 09/23/2021   BILITOT 0.3 04/27/2022   ALKPHOS 52 04/27/2022   AST 25 04/27/2022   ALT 16 04/27/2022   ANIONGAP 13 06/06/2021      The ASCVD Risk score (Arnett DK, et al., 2019) failed to calculate for the following reasons:   The 2019 ASCVD risk score is only valid for ages 53 to 40    Assessment & Plan:   Problem List Items Addressed This Visit       Cardiovascular and Mediastinum   Essential hypertension - Primary    BP is elevated, will rx chlorthalidone 25 mg daily, pt has a BP cuff at home and I advised she check her BP daily and send me the readings in about 2-3 weeks.      Relevant Medications   chlorthalidone (HYGROTON) 25 MG tablet     Other   GAD (generalized anxiety disorder)    On prozac and PRN ativan, reports she still has great difficulty with her anxiety symptoms.I advised that she should not use the Ativan regularly due to the high risk nature of the medication, causing potential withdrawal symptoms and dependency. We discussed that the use of mood stabilization may be appropriate. Patient is willing to try lamotrigine to help with her symptoms. Will start 25 mg and titrate up to 50 mg BID. I will see her back in 5 weeks for a video visit to reassess her symptoms.       Relevant Medications   FLUoxetine (PROZAC) 40 MG capsule   LamoTRIgine (LAMICTAL XR) 25 MG TB24 24 hour tablet   Vitamin D deficiency (Chronic)    Last level 09/2021 was very low, she is currently on 2000 units daily, will refill this and recheck level in about 6 months.      Relevant Medications   Cholecalciferol (VITAMIN D3) 50 MCG (2000 UT) capsule    Return in about 5 weeks (around 09/21/2022) for Video visit for follow up on medication changes.    Farrel Conners, MD

## 2022-08-17 NOTE — Patient Instructions (Addendum)
Check Blood pressure daily -- message me your readings over MyChart in about 2-3 weeks.

## 2022-08-19 DIAGNOSIS — E559 Vitamin D deficiency, unspecified: Secondary | ICD-10-CM | POA: Insufficient documentation

## 2022-08-19 NOTE — Assessment & Plan Note (Signed)
Last level 09/2021 was very low, she is currently on 2000 units daily, will refill this and recheck level in about 6 months.

## 2022-08-19 NOTE — Assessment & Plan Note (Signed)
BP is elevated, will rx chlorthalidone 25 mg daily, pt has a BP cuff at home and I advised she check her BP daily and send me the readings in about 2-3 weeks.

## 2022-08-24 ENCOUNTER — Telehealth: Payer: Self-pay | Admitting: Family Medicine

## 2022-08-24 MED ORDER — ONDANSETRON 4 MG PO TBDP: 4.0000 mg | ORAL_TABLET | Freq: Three times a day (TID) | ORAL | 0 refills | Status: DC | PRN

## 2022-08-24 MED ORDER — PROMETHAZINE HCL 25 MG RE SUPP: 25.0000 mg | Freq: Three times a day (TID) | RECTAL | 0 refills | Status: AC | PRN

## 2022-08-24 NOTE — Telephone Encounter (Signed)
Requesting refill of ondansetron (ZOFRAN ODT) 4 MG disintegrating tablet, promethazine (PHENERGAN) 25 MG suppository   [facility] [phone] - [place], [place] - [address] Phone:  [phone]  Fax:  [phone]    Patient is requesting a work excuse for today and tomorrow. Pls call her with questions

## 2022-08-24 NOTE — Telephone Encounter (Signed)
Ok to refill the medications however I cannot give her a work excuse unless she was seen

## 2022-08-24 NOTE — Telephone Encounter (Signed)
Rx done.  Unable to leave a message due to voicemail being full.

## 2022-08-25 NOTE — Telephone Encounter (Signed)
Patient informed of the message below.  I offered an appt today, patient declined and stated she is OK.

## 2022-09-06 ENCOUNTER — Ambulatory Visit: Payer: Commercial Managed Care - HMO | Admitting: Family Medicine

## 2022-10-13 ENCOUNTER — Other Ambulatory Visit: Payer: Self-pay | Admitting: *Deleted

## 2022-10-13 DIAGNOSIS — K2970 Gastritis, unspecified, without bleeding: Secondary | ICD-10-CM

## 2022-10-13 MED ORDER — OMEPRAZOLE 40 MG PO CPDR: DELAYED_RELEASE_CAPSULE | ORAL | 1 refills | Status: DC

## 2022-12-27 ENCOUNTER — Telehealth: Payer: Commercial Managed Care - HMO | Admitting: Physician Assistant

## 2022-12-27 DIAGNOSIS — R6889 Other general symptoms and signs: Secondary | ICD-10-CM

## 2022-12-27 DIAGNOSIS — R0602 Shortness of breath: Secondary | ICD-10-CM

## 2022-12-27 DIAGNOSIS — R0789 Other chest pain: Secondary | ICD-10-CM

## 2022-12-28 NOTE — Progress Notes (Signed)
Because of chest pain and shortness of breath along with these flu-like symptoms, and need for testing to rule out COVID as well in this case, I feel your condition warrants further evaluation and I recommend that you be seen in a face to face visit.   NOTE: There will be NO CHARGE for this eVisit   If you are having a true medical emergency please call 911.      For an urgent face to face visit, [place] has eight urgent care centers for your convenience:   NEW!! [facility] [place] [facility] [place] Get Driving Directions [phone] [address] [place], [place] [facility] [place] Get Driving Directions [phone] [place], [place] [address]   [facility]) Get Driving Directions [phone] [address], [place] [address]  [facility] [place] ([place]) Get Driving Directions [phone] [address] [place],  [place]  [address]  [facility] [address] Get Driving Directions  [phone] [address] [place],  [place] [address]   [facility]  Get Driving Directions [phone] [place], [place], [place] [address]   [facility] [place] Get Driving Directions  [phone] [address] [place], [place] [address]   [facility] [place] Get Driving Directions [phone] [address] [place], [place] [address]  Your MyChart E-visit questionnaire answers were reviewed by a board certified advanced clinical practitioner to complete your personal care plan based on your specific symptoms.  Thank you for using e-Visits.

## 2023-02-24 ENCOUNTER — Telehealth (INDEPENDENT_AMBULATORY_CARE_PROVIDER_SITE_OTHER): Payer: Commercial Managed Care - HMO | Admitting: Family Medicine

## 2023-02-24 ENCOUNTER — Encounter: Payer: Self-pay | Admitting: Family Medicine

## 2023-02-24 DIAGNOSIS — F411 Generalized anxiety disorder: Secondary | ICD-10-CM | POA: Diagnosis not present

## 2023-02-24 DIAGNOSIS — I10 Essential (primary) hypertension: Secondary | ICD-10-CM | POA: Diagnosis not present

## 2023-02-24 MED ORDER — FLUOXETINE HCL 20 MG PO CAPS: 40.0000 mg | ORAL_CAPSULE | Freq: Every day | ORAL | 5 refills | Status: DC

## 2023-02-24 MED ORDER — LORAZEPAM 0.5 MG PO TABS: 0.5000 mg | ORAL_TABLET | Freq: Every day | ORAL | 0 refills | Status: DC | PRN

## 2023-02-24 MED ORDER — HYDROXYZINE PAMOATE 25 MG PO CAPS: 25.0000 mg | ORAL_CAPSULE | Freq: Four times a day (QID) | ORAL | 2 refills | Status: DC | PRN

## 2023-02-24 MED ORDER — CHLORTHALIDONE 25 MG PO TABS: 25.0000 mg | ORAL_TABLET | Freq: Every day | ORAL | 1 refills | Status: DC

## 2023-02-24 MED ORDER — LAMOTRIGINE ER 25 MG PO TB24: 50.0000 mg | ORAL_TABLET | Freq: Every day | ORAL | 5 refills | Status: DC

## 2023-02-24 NOTE — Assessment & Plan Note (Signed)
Pt is off all her medication. She is requesting a referral to a new psychiatrist and a therapist. Will place referrals for her and restart her medication, starting with 25 mg of the lamotrigine daily, increasing to 50 mg daily, and starting with 20 mg fluoxetine daily, increasing back to 40 mg daily. Pt is asking for something to take PRN for acute anxiety, I recommended she use hydroxyzine 25 mg every 6 hours PRN for anxiety, I did not advise continuing benzodiazepines for regular use as these are high risk medications with a risk of dependency and withdrawal. Pt voiced understanding of the risks and asked for a small supply until she can be evaluated by the psychiatrist. I will give her 10 tablets as a ONE TIME script and then she will use the hydroxyzine instead. RTC in 2 months in office.

## 2023-02-24 NOTE — Assessment & Plan Note (Signed)
Pt is off her BP medication, I will restart the medication, sending in refills of this today. I will see her back in 2 months in the office for a BP recheck.

## 2023-02-24 NOTE — Progress Notes (Signed)
Virtual Medical Office Visit  Patient:  Meagan Mason      Age: 42 y.o.       Sex:  female  Date:   02/24/2023  PCP:    Farrel Conners, MD   [place] Provider: Farrel Conners, MD    Assessment/Plan:   Summary assessment:  Meagan Mason was seen today for medical management of chronic issues and referral.  GAD (generalized anxiety disorder) Assessment & Plan: Pt is off all her medication. She is requesting a referral to a new psychiatrist and a therapist. Will place referrals for her and restart her medication, starting with 25 mg of the lamotrigine daily, increasing to 50 mg daily, and starting with 20 mg fluoxetine daily, increasing back to 40 mg daily. Pt is asking for something to take PRN for acute anxiety, I recommended she use hydroxyzine 25 mg every 6 hours PRN for anxiety, I did not advise continuing benzodiazepines for regular use as these are high risk medications with a risk of dependency and withdrawal. Pt voiced understanding of the risks and asked for a small supply until she can be evaluated by the psychiatrist. I will give her 10 tablets as a ONE TIME script and then she will use the hydroxyzine instead. RTC in 2 months in office.  Orders: -     Ambulatory referral to Psychiatry -     Ambulatory referral to Psychology -     lamoTRIgine ER; Take 2 tablets (50 mg total) by mouth daily. Start with 1 tablet daily for 7 days, then increase to 2 tablets daily.  Dispense: 60 tablet; Refill: 5 -     FLUoxetine HCl; Take 2 capsules (40 mg total) by mouth daily. Start with 1 capsule daily for at least 2-3 weeks, then increase to 2 capsules daily  Dispense: 60 capsule; Refill: 5 -     hydrOXYzine Pamoate; Take 1 capsule (25 mg total) by mouth every 6 (six) hours as needed.  Dispense: 60 capsule; Refill: 2 -     LORazepam; Take 1 tablet (0.5 mg total) by mouth daily as needed for anxiety. ADDITIONAL REFILLS FROM PSYCHIATRY.  Dispense: 10 tablet; Refill: 0  Essential  hypertension Assessment & Plan: Pt is off her BP medication, I will restart the medication, sending in refills of this today. I will see her back in 2 months in the office for a BP recheck.   Orders: -     Chlorthalidone; Take 1 tablet (25 mg total) by mouth daily.  Dispense: 90 tablet; Refill: 1   I spent a total of 45 minutes with the patient today discussing the risks/benefits of using benzodiazepines and instead recommending hydroxyzine. Sent in referral for psychiatry and psychology for therapy.   Return in about 2 months (around 04/26/2023) for HTN needs to be in office follow up.   She was advised to call the office or go to ER if her condition worsens    Subjective:   Meagan Mason is a 42 y.o. female with PMH significant for: Past Medical History:  Diagnosis Date   Allergy    Anxiety    Asthma    states only when stressed out   Depression    History of bronchitis    in college   Neck pain    ruptured disc in neck   URI (upper respiratory infection)    early Nov 2014   Weakness    numbness in left arm     Presenting today with: Chief  Complaint  Patient presents with   Medical Management of Chronic Issues    Patient states she has discontinued a lot of her medications and requests to restart   Referral    Patient requests a referral to psychiatrist for refills on Ativan, Prozac, Zofran and Promethazine     She clarifies and reports that her condition: Pt states she hash been off of the prozac and the lamotrigine since October, states that she has only been taking her nausea medication Hasn't been taking her BP medication either. States that she wants to "start from scratch" and restart all of her meds and "do it right" this time.  States that last week she had an "episode" of nausea, vomiting. States that this has been happening for years, states that it is related to her anxiety issues, states that she usually calls the mobile IV hydration and they give her IV  medications and fluids to help her recover. States she has been told that it cold be abdominal migraines or cyclical vomiting syndrome.  We had a long 30 minutes discussion about the use of benzodiazepines and their high risk nature. Pt states that she just took her last pill and that this medication helps during her acute attacks. We discussed the risk of dependency, withdrawal, etc. With the regular use of this medication. I advised that if she requires further rx's of this medication she would have to see the psychiatrist.    She denies having any: Fever/chills, weight loss, chest pain.           Objective/Observations  Physical Exam:  Polite and friendly, anxious, speech is slightly fast/pressured, but it is not tangential and there are no delusions or hallucinations, it is well structured and the thought content is normal.  Gen: NAD, resting comfortably Pulm: Normal work of breathing Neuro: Grossly normal, moves all extremities Psych: Normal affect and thought content Problem specific physical exam findings: N/A  No images are attached to the encounter or orders placed in the encounter.    Results: No results found for any visits on 02/24/23.   No results found for this or any previous visit (from the past 2160 hour(s)).         Virtual Visit via Video   I connected with Rinki Jurkowski on 02/24/23 at  9:00 AM EDT by a video enabled telemedicine application and verified that I am speaking with the correct person using two identifiers. The limitations of evaluation and management by telemedicine and the availability of in person appointments were discussed. The patient expressed understanding and agreed to proceed.   Percentage of appointment time on video:  100% Patient location: Home Provider location: [place] participating in the virtual visit: Myself and Patient

## 2023-06-07 ENCOUNTER — Ambulatory Visit (HOSPITAL_COMMUNITY): Payer: Commercial Managed Care - HMO | Admitting: Psychiatry

## 2023-07-05 ENCOUNTER — Ambulatory Visit (HOSPITAL_COMMUNITY): Payer: Commercial Managed Care - HMO | Admitting: Psychiatry

## 2023-08-04 ENCOUNTER — Telehealth (HOSPITAL_COMMUNITY): Payer: Self-pay

## 2023-08-04 NOTE — Telephone Encounter (Signed)
Spoke with pt confirmed appt for 08/09/23 with Dr Adrian Blackwater at 2:38 PM 08/04/23

## 2023-08-09 ENCOUNTER — Ambulatory Visit (INDEPENDENT_AMBULATORY_CARE_PROVIDER_SITE_OTHER): Payer: Commercial Managed Care - HMO | Admitting: Psychiatry

## 2023-08-09 ENCOUNTER — Encounter (HOSPITAL_COMMUNITY): Payer: Self-pay | Admitting: Psychiatry

## 2023-08-09 DIAGNOSIS — F1290 Cannabis use, unspecified, uncomplicated: Secondary | ICD-10-CM

## 2023-08-09 DIAGNOSIS — F1994 Other psychoactive substance use, unspecified with psychoactive substance-induced mood disorder: Secondary | ICD-10-CM | POA: Diagnosis not present

## 2023-08-09 DIAGNOSIS — F1090 Alcohol use, unspecified, uncomplicated: Secondary | ICD-10-CM

## 2023-08-09 DIAGNOSIS — F19982 Other psychoactive substance use, unspecified with psychoactive substance-induced sleep disorder: Secondary | ICD-10-CM | POA: Insufficient documentation

## 2023-08-09 DIAGNOSIS — Z720 Tobacco use: Secondary | ICD-10-CM
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
## 2023-08-09 NOTE — Patient Instructions (Addendum)
We did not make any medication changes today per your preference.  I would reach out to your insurance provider to ask for a CBT (cognitive behavioral therapy) provider that is in network.  As you are able, try to cut back on the amount of alcohol and marijuana that you are using as these both can impact anxiety and depression as well as sleep and lead to more nausea and vomiting.  If you are interested in smoking cessation, you could find free resources here: http://skinner-[email or web address]

## 2023-11-22 ENCOUNTER — Telehealth: Payer: Self-pay | Admitting: Nurse Practitioner

## 2023-11-22 ENCOUNTER — Ambulatory Visit (INDEPENDENT_AMBULATORY_CARE_PROVIDER_SITE_OTHER): Payer: Commercial Managed Care - HMO

## 2023-11-22 ENCOUNTER — Ambulatory Visit: Payer: Commercial Managed Care - HMO

## 2023-11-22 ENCOUNTER — Ambulatory Visit
Admission: EM | Admit: 2023-11-22 | Discharge: 2023-11-22 | Disposition: A | Discharge status: Home / Self Care | Payer: Commercial Managed Care - HMO | Attending: Nurse Practitioner | Admitting: Nurse Practitioner

## 2023-11-22 ENCOUNTER — Encounter: Payer: Self-pay | Admitting: Emergency Medicine

## 2023-11-22 DIAGNOSIS — S8001XA Contusion of right knee, initial encounter: Secondary | ICD-10-CM | POA: Diagnosis not present

## 2023-11-22 DIAGNOSIS — M25561 Pain in right knee: Secondary | ICD-10-CM

## 2023-11-22 DIAGNOSIS — M25571 Pain in right ankle and joints of right foot: Secondary | ICD-10-CM | POA: Diagnosis not present

## 2023-11-22 DIAGNOSIS — S8251XA Displaced fracture of medial malleolus of right tibia, initial encounter for closed fracture: Secondary | ICD-10-CM

## 2023-11-22 DIAGNOSIS — S8011XA Contusion of right lower leg, initial encounter: Secondary | ICD-10-CM

## 2023-11-22 HISTORY — DX: Essential (primary) hypertension: I10

## 2023-11-22 MED ORDER — IBUPROFEN 800 MG PO TABS: 800.0000 mg | ORAL_TABLET | Freq: Three times a day (TID) | ORAL | 0 refills | Status: DC

## 2023-11-22 MED ORDER — CRUTCHES-ALUMINUM MISC: 1.0000 | Freq: Every day | 0 refills | Status: AC

## 2023-11-22 MED ORDER — CRUTCHES-ALUMINUM MISC: 1.0000 | Freq: Every day | 0 refills | Status: DC

## 2023-11-22 NOTE — ED Notes (Signed)
Unable to give patient crutches.  We do no have the size she needs available.  Rx sent to [facility]. for Crutches

## 2023-11-22 NOTE — Telephone Encounter (Signed)
Called patient to discuss x-ray results.  Spoke with patient, verified 2 patient identifiers.  Advised patient that x-ray of the right ankle did verify a fracture of the medial malleolus.  X-ray of the right knee were negative.  Advised patient to proceed with current plan of care.  Patient was in agreement with this plan and verbalized understanding.  All questions were answered.

## 2023-11-22 NOTE — ED Notes (Signed)
Ice pack applied to right ankle.
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
## 2023-11-22 NOTE — Discharge Instructions (Addendum)
Suspect you have a fracture of the right ankle.  X-ray of the knee is pending.  You will be contacted when the results of the x-rays are received.  You also access to the results via MyChart. Take medication as prescribed. Weightbearing only as needed.  Try to refrain from walking unless absolutely necessary. RICE therapy, rest, ice, compression, and elevation.  Apply ice for 20 minutes, remove for 1 hour, repeat is much as possible to help with pain and swelling. Would like for you to follow-up with orthopedics on 08/24/2023.  As discussed, you may follow-up with [facility] or with [facility].  If you choose to go to [facility], a referral has been placed. Follow-up as needed.
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
## 2023-11-23 ENCOUNTER — Ambulatory Visit: Payer: Self-pay

## 2023-11-24 ENCOUNTER — Other Ambulatory Visit: Payer: Self-pay

## 2023-11-24 ENCOUNTER — Encounter (HOSPITAL_BASED_OUTPATIENT_CLINIC_OR_DEPARTMENT_OTHER): Payer: Self-pay | Admitting: Orthopaedic Surgery

## 2023-11-24 NOTE — Progress Notes (Signed)
Spoke w/ via phone for pre-op [employer]---- UPT, ISTAT, EKG per anesthesia, surgeon orders pending        Lab results------none COVID test -----patient states asymptomatic no test needed Arrive at -------0830 on Thursday, 12/01/2023 NPO after MN NO Solid Food.  Clear liquids from MN until---0730 Med rec completed Medications to take morning of surgery -----If needed, may take: Albuterol inhaler, Zofran, Omeprazole, Lorazepam Diabetic medication -----n/a Patient instructed no nail polish to be worn day of surgery Patient instructed to bring photo id and insurance card day of surgery Patient aware to have Driver (ride ) / caregiver    for 24 hours after surgery - mother, Meagan Mason Patient Special Instructions -----Bring albuterol inhaler with you on day of surgery. Pre-Op special Instructions -----Requested orders from Dr. Odis Hollingshead via Epic IB on 11/24/2023. Patient verbalized understanding of instructions that were given at this phone interview. Patient denies chest pain, sob, fever, cough at the interview.

## 2023-11-25 ENCOUNTER — Other Ambulatory Visit: Payer: Self-pay | Admitting: Otolaryngology

## 2023-11-25 DIAGNOSIS — S8251XA Displaced fracture of medial malleolus of right tibia, initial encounter for closed fracture: Secondary | ICD-10-CM

## 2023-11-28 ENCOUNTER — Ambulatory Visit
Admission: RE | Admit: 2023-11-28 | Discharge: 2023-11-28 | Disposition: A | Discharge status: Home / Self Care | Payer: Commercial Managed Care - HMO | Source: Ambulatory Visit | Attending: Otolaryngology | Admitting: Otolaryngology

## 2023-11-28 ENCOUNTER — Ambulatory Visit: Payer: Commercial Managed Care - HMO

## 2023-11-28 DIAGNOSIS — S8251XA Displaced fracture of medial malleolus of right tibia, initial encounter for closed fracture: Secondary | ICD-10-CM

## 2023-11-29 NOTE — Discharge Instructions (Signed)

## 2023-11-29 NOTE — H&P (Signed)
ORTHOPAEDIC SURGERY H&P  Subjective:  The patient presents with right ankle fx.   Past Medical History:  Diagnosis Date   Alcohol use disorder    hx of   Allergy    Anxiety    Follows with Behavioral Health. Patient states no current psychiatrist or therapist.   Asthma    states only when stressed out, only uses about once a year per patient 11/24/2023   Cyclic vomiting syndrome    Depression    GERD (gastroesophageal reflux disease)    History of bronchitis    in college   Hypertension    Pt states that her blood pressure runs a little high normally, but she is currently not taking any meds. She states that it gets higher if she is having an episode of cyclic vomiting. She also states that she will probably be going back on BP medicine when she finds a new doctor.   Neck pain 2015   ruptured disc in neck   Weakness 2015   numbness in left arm, improved per pt 11/24/23    Past Surgical History:  Procedure Laterality Date   WISDOM TOOTH EXTRACTION       (Not in an outpatient encounter)    Allergies  Allergen Reactions   Other Shortness Of Breath and Itching    Pecans, tree nuts    Poison Ivy Extract Rash   Benadryl [Diphenhydramine] Other (See Comments)    hyperactivity    Social History   Socioeconomic History   Marital status: Single    Spouse name: Not on file   Number of children: Not on file   Years of education: Not on file   Highest education level: Not on file  Occupational History   Not on file  Tobacco Use   Smoking status: Every Day    Current packs/day: 1.00    Average packs/day: 1 pack/day for 10.0 years (10.0 ttl pk-yrs)    Types: Cigarettes   Smokeless tobacco: Never   Tobacco comments:    Two packs per week per pt on 11/24/2023.  Vaping Use   Vaping status: Some Days   Substances: THC, CBD  Substance and Sexual Activity   Alcohol use: Yes    Alcohol/week: 6.0 standard drinks of alcohol    Types: 6 Cans of beer per week    Comment:  history of heavy alcohol use   Drug use: Yes    Types: Marijuana    Comment: 0 -2 times per day   Sexual activity: Yes    Birth control/protection: None  Other Topics Concern   Not on file  Social History Narrative   Engaged.   College degree, bachelors. Works in Lexicographer.   Occasional alcohol. Every day smoker. No drugs.   Drink caffeinated beverages.   Wears her seatbelt and bicycle helmet.   Smoke detector at home.   Social Drivers of Corporate investment banker Strain: Not on file  Food Insecurity: Not on file  Transportation Needs: Not on file  Physical Activity: Not on file  Stress: Not on file  Social Connections: Not on file  Intimate Partner Violence: Not on file     History reviewed. No pertinent family history.   Review of Systems Pertinent items are noted in HPI.  Objective: Vital signs in last 24 hours:    11/24/2023   11:14 AM 11/22/2023    3:45 PM 08/17/2022   10:28 AM  Vitals with BMI  Height 5\' 4"   Weight 140 lbs    BMI 24.02    Systolic  164 152  Diastolic  657 82  Pulse  107       EXAM: General: Well nourished, well developed. Awake, alert and oriented to time, place, person. Normal mood and affect. No apparent distress. Breathing room air.  Operative Lower Extremity: Alignment - Neutral Deformity - None Skin intact Tenderness to palpation - right ankle 5/5 TA, PT, GS, Per, EHL, FHL Sensation intact to light touch throughout Palpable DP and PT pulses Special testing: None  The contralateral foot/ankle was examined for comparison and noted to be neurovascularly intact with no localized deformity, swelling, or tenderness.  Imaging Review All images taken were independently reviewed by me.  Assessment/Plan: The clinical and radiographic findings were reviewed and discussed at length with the patient.  The patient has right ankle fx.  We spoke at length about the natural course of these findings. We discussed  nonoperative and operative treatment options in detail.  The risks and benefits were presented and reviewed. The risks due to hardware failure/irritation, new/persistent/recurrent infection, stiffness, nerve/vessel/tendon injury, nonunion/malunion of any fracture, wound healing issues, allograft usage, development of arthritis, failure of this surgery, possibility of external fixation in certain situations, possibility of delayed definitive surgery, need for further surgery, prolonged wound care including further soft tissue coverage procedures, thromboembolic events, anesthesia/medical complications/events perioperatively and beyond, amputation, death among others were discussed. The patient acknowledged the explanation and agreed to proceed with the plan.  Meagan Mason  Orthopaedic Surgery [facility]

## 2023-12-01 ENCOUNTER — Ambulatory Visit (HOSPITAL_BASED_OUTPATIENT_CLINIC_OR_DEPARTMENT_OTHER)
Admission: RE | Admit: 2023-12-01 | Discharge: 2023-12-01 | Disposition: A | Discharge status: Home / Self Care | Payer: Commercial Managed Care - HMO | Source: Ambulatory Visit | Attending: Orthopaedic Surgery | Admitting: Orthopaedic Surgery

## 2023-12-01 ENCOUNTER — Encounter (HOSPITAL_BASED_OUTPATIENT_CLINIC_OR_DEPARTMENT_OTHER): Payer: Self-pay | Admitting: Orthopaedic Surgery

## 2023-12-01 ENCOUNTER — Ambulatory Visit (HOSPITAL_COMMUNITY): Payer: Commercial Managed Care - HMO

## 2023-12-01 ENCOUNTER — Encounter (HOSPITAL_BASED_OUTPATIENT_CLINIC_OR_DEPARTMENT_OTHER)
Admission: RE | Disposition: A | Discharge status: Home / Self Care | Payer: Self-pay | Source: Ambulatory Visit | Attending: Orthopaedic Surgery

## 2023-12-01 ENCOUNTER — Ambulatory Visit (HOSPITAL_BASED_OUTPATIENT_CLINIC_OR_DEPARTMENT_OTHER): Payer: Commercial Managed Care - HMO | Admitting: Anesthesiology

## 2023-12-01 ENCOUNTER — Other Ambulatory Visit: Payer: Self-pay

## 2023-12-01 DIAGNOSIS — I10 Essential (primary) hypertension: Secondary | ICD-10-CM | POA: Insufficient documentation

## 2023-12-01 DIAGNOSIS — Z01818 Encounter for other preprocedural examination: Secondary | ICD-10-CM

## 2023-12-01 DIAGNOSIS — S82871A Displaced pilon fracture of right tibia, initial encounter for closed fracture: Secondary | ICD-10-CM

## 2023-12-01 DIAGNOSIS — X58XXXA Exposure to other specified factors, initial encounter: Secondary | ICD-10-CM | POA: Insufficient documentation

## 2023-12-01 DIAGNOSIS — F17210 Nicotine dependence, cigarettes, uncomplicated: Secondary | ICD-10-CM | POA: Diagnosis not present

## 2023-12-01 DIAGNOSIS — F17290 Nicotine dependence, other tobacco product, uncomplicated: Secondary | ICD-10-CM | POA: Insufficient documentation

## 2023-12-01 HISTORY — PX: ORIF ANKLE FRACTURE: SHX5408

## 2023-12-01 HISTORY — DX: Gastro-esophageal reflux disease without esophagitis: K21.9

## 2023-12-01 HISTORY — DX: Alcohol use, unspecified, uncomplicated: F10.90

## 2023-12-01 HISTORY — DX: Cyclical vomiting syndrome unrelated to migraine: R11.15

## 2023-12-01 LAB — POCT I-STAT, CHEM 8
BUN: 6 mg/dL (ref 6–20)
Calcium, Ion: 1.21 mmol/L (ref 1.15–1.40)
Chloride: 104 mmol/L (ref 98–111)
Creatinine, Ser: 0.6 mg/dL (ref 0.44–1.00)
Glucose, Bld: 90 mg/dL (ref 70–99)
HCT: 36 % (ref 36.0–46.0)
Hemoglobin: 12.2 g/dL (ref 12.0–15.0)
Potassium: 3.4 mmol/L — ABNORMAL LOW (ref 3.5–5.1)
Sodium: 141 mmol/L (ref 135–145)
TCO2: 26 mmol/L (ref 22–32)

## 2023-12-01 LAB — SURGICAL PCR SCREEN
MRSA, PCR: NEGATIVE
Staphylococcus aureus: NEGATIVE

## 2023-12-01 LAB — POCT PREGNANCY, URINE: Preg Test, Ur: NEGATIVE

## 2023-12-01 SURGERY — OPEN REDUCTION INTERNAL FIXATION (ORIF) ANKLE FRACTURE
Anesthesia: General | Site: Ankle | Laterality: Right

## 2023-12-01 MED ORDER — CEFAZOLIN SODIUM-DEXTROSE 2-4 GM/100ML-% IV SOLN
2.0000 g | INTRAVENOUS | Status: AC
  Administered 2023-12-01: 2 g via INTRAVENOUS

## 2023-12-01 MED ORDER — LIDOCAINE-EPINEPHRINE 2 %-1:100000 IJ SOLN
INTRAMUSCULAR | Status: DC | PRN
  Administered 2023-12-01: 10 mL via PERINEURAL

## 2023-12-01 MED ORDER — EPHEDRINE SULFATE (PRESSORS) 50 MG/ML IJ SOLN
INTRAMUSCULAR | Status: DC | PRN
  Administered 2023-12-01: 5 mg via INTRAVENOUS

## 2023-12-01 MED ORDER — ONDANSETRON HCL 4 MG/2ML IJ SOLN
INTRAMUSCULAR | Status: DC | PRN
  Administered 2023-12-01: 4 mg via INTRAVENOUS

## 2023-12-01 MED ORDER — DEXAMETHASONE SODIUM PHOSPHATE 4 MG/ML IJ SOLN
INTRAMUSCULAR | Status: DC | PRN
  Administered 2023-12-01: 10 mg via INTRAVENOUS

## 2023-12-01 MED ORDER — ACETAMINOPHEN 500 MG PO TABS
1000.0000 mg | ORAL_TABLET | Freq: Once | ORAL | Status: AC
Start: 2023-12-01 — End: 2023-12-01
  Administered 2023-12-01: 1000 mg via ORAL

## 2023-12-01 MED ORDER — DEXMEDETOMIDINE HCL IN NACL 80 MCG/20ML IV SOLN
INTRAVENOUS | Status: DC | PRN
  Administered 2023-12-01: 4 ug via INTRAVENOUS

## 2023-12-01 MED ORDER — FENTANYL CITRATE (PF) 100 MCG/2ML IJ SOLN
INTRAMUSCULAR | Status: AC
  Filled 2023-12-01: qty 2

## 2023-12-01 MED ORDER — MIDAZOLAM HCL 2 MG/2ML IJ SOLN
INTRAMUSCULAR | Status: AC
  Filled 2023-12-01: qty 2

## 2023-12-01 MED ORDER — FENTANYL CITRATE (PF) 100 MCG/2ML IJ SOLN
INTRAMUSCULAR | Status: DC | PRN
  Administered 2023-12-01: 50 ug via INTRAVENOUS
  Administered 2023-12-01: 100 ug via INTRAVENOUS
  Administered 2023-12-01: 50 ug via INTRAVENOUS

## 2023-12-01 MED ORDER — VANCOMYCIN HCL 500 MG IV SOLR
INTRAVENOUS | Status: DC | PRN
  Administered 2023-12-01: 1000 mg

## 2023-12-01 MED ORDER — CHLORHEXIDINE GLUCONATE 4 % EX SOLN: 60.0000 mL | Freq: Once | CUTANEOUS | Status: DC

## 2023-12-01 MED ORDER — FENTANYL CITRATE (PF) 100 MCG/2ML IJ SOLN
100.0000 ug | Freq: Once | INTRAMUSCULAR | Status: AC
  Administered 2023-12-01: 100 ug via INTRAVENOUS

## 2023-12-01 MED ORDER — ACETAMINOPHEN 500 MG PO TABS
ORAL_TABLET | ORAL | Status: AC
  Filled 2023-12-01: qty 2

## 2023-12-01 MED ORDER — MIDAZOLAM HCL 2 MG/2ML IJ SOLN
2.0000 mg | Freq: Once | INTRAMUSCULAR | Status: AC
  Administered 2023-12-01: 2 mg via INTRAVENOUS

## 2023-12-01 MED ORDER — PHENYLEPHRINE HCL (PRESSORS) 10 MG/ML IV SOLN
INTRAVENOUS | Status: DC | PRN
  Administered 2023-12-01 (×2): 80 ug via INTRAVENOUS

## 2023-12-01 MED ORDER — CEFAZOLIN SODIUM-DEXTROSE 2-4 GM/100ML-% IV SOLN
INTRAVENOUS | Status: AC
  Filled 2023-12-01: qty 100

## 2023-12-01 MED ORDER — PROPOFOL 10 MG/ML IV BOLUS
INTRAVENOUS | Status: DC | PRN
  Administered 2023-12-01: 200 mg via INTRAVENOUS

## 2023-12-01 MED ORDER — ONDANSETRON HCL 4 MG/2ML IJ SOLN
INTRAMUSCULAR | Status: AC
  Filled 2023-12-01: qty 4

## 2023-12-01 MED ORDER — BUPIVACAINE HCL (PF) 0.5 % IJ SOLN
INTRAMUSCULAR | Status: DC | PRN
  Administered 2023-12-01: 25 mL via PERINEURAL

## 2023-12-01 MED ORDER — LIDOCAINE HCL (CARDIAC) PF 100 MG/5ML IV SOSY
PREFILLED_SYRINGE | INTRAVENOUS | Status: DC | PRN
  Administered 2023-12-01: 50 mg via INTRAVENOUS

## 2023-12-01 MED ORDER — CLONIDINE HCL (ANALGESIA) 100 MCG/ML EP SOLN
EPIDURAL | Status: DC | PRN
  Administered 2023-12-01: 100 ug

## 2023-12-01 MED ORDER — DEXAMETHASONE SODIUM PHOSPHATE 10 MG/ML IJ SOLN
INTRAMUSCULAR | Status: AC
  Filled 2023-12-01: qty 2

## 2023-12-01 MED ORDER — EPHEDRINE 5 MG/ML INJ
INTRAVENOUS | Status: AC
  Filled 2023-12-01: qty 10

## 2023-12-01 MED ORDER — MIDAZOLAM HCL 2 MG/2ML IJ SOLN
INTRAMUSCULAR | Status: DC | PRN
  Administered 2023-12-01: 2 mg via INTRAVENOUS

## 2023-12-01 MED ORDER — STERILE WATER FOR IRRIGATION IR SOLN
Status: DC | PRN
  Administered 2023-12-01: 500 mL

## 2023-12-01 MED ORDER — ROPIVACAINE HCL 5 MG/ML IJ SOLN
INTRAMUSCULAR | Status: DC | PRN
  Administered 2023-12-01: 20 mL via PERINEURAL

## 2023-12-01 MED ORDER — LACTATED RINGERS IV SOLN: INTRAVENOUS | Status: DC

## 2023-12-01 MED ORDER — SODIUM CHLORIDE 0.9 % IV SOLN: INTRAVENOUS | Status: DC

## 2023-12-01 MED ORDER — MUPIROCIN 2 % EX OINT
1.0000 | TOPICAL_OINTMENT | Freq: Two times a day (BID) | CUTANEOUS | Status: DC
  Filled 2023-12-01: qty 22

## 2023-12-01 MED ORDER — PHENYLEPHRINE 80 MCG/ML (10ML) SYRINGE FOR IV PUSH (FOR BLOOD PRESSURE SUPPORT)
PREFILLED_SYRINGE | INTRAVENOUS | Status: AC
  Filled 2023-12-01: qty 20

## 2023-12-01 SURGICAL SUPPLY — 63 items
2.0 long drill ×1 IMPLANT
BANDAGE ESMARK 6X9 LF (GAUZE/BANDAGES/DRESSINGS) ×2 IMPLANT
BIT DRILL 2 CANN GRADUATED (BIT) ×1 IMPLANT
BIT DRILL 2 LNG CALIBR (DRILL) ×1 IMPLANT
BIT DRILL 2.5 CANN STRL (BIT) ×1 IMPLANT
BLADE SURG 15 STRL LF DISP TIS (BLADE) ×4 IMPLANT
BNDG COHESIVE 4X5 TAN STRL LF (GAUZE/BANDAGES/DRESSINGS) IMPLANT
BNDG ELASTIC 6X15 VLCR STRL LF (GAUZE/BANDAGES/DRESSINGS) ×2 IMPLANT
BNDG ESMARK 6X9 LF (GAUZE/BANDAGES/DRESSINGS) ×2
BNDG GAUZE DERMACEA FLUFF 4 (GAUZE/BANDAGES/DRESSINGS) ×2 IMPLANT
BRUSH SCRUB EZ 4% CHG (MISCELLANEOUS) ×2 IMPLANT
CHLORAPREP W/TINT 26 (MISCELLANEOUS) ×4 IMPLANT
COVER BACK TABLE 60X90IN (DRAPES) ×2 IMPLANT
CUFF TRNQT CYL 24X4X16.5-23 (TOURNIQUET CUFF) ×1 IMPLANT
CUFF TRNQT CYL 34X4.125X (TOURNIQUET CUFF) ×1 IMPLANT
DRAPE C-ARM 42X120 X-RAY (DRAPES) ×2 IMPLANT
DRAPE C-ARMOR (DRAPES) ×2 IMPLANT
DRAPE EXTREMITY T 121X128X90 (DISPOSABLE) ×2 IMPLANT
DRAPE IMP U-DRAPE 54X76 (DRAPES) ×2 IMPLANT
DRAPE SHEET LG 3/4 BI-LAMINATE (DRAPES) ×2 IMPLANT
DRAPE U-SHAPE 47X51 STRL (DRAPES) ×2 IMPLANT
DRSG MEPITEL 4X7.2 (GAUZE/BANDAGES/DRESSINGS) ×2 IMPLANT
DRSG MEPITEL 8X12 (GAUZE/BANDAGES/DRESSINGS) ×1 IMPLANT
ELECT REM PT RETURN 9FT ADLT (ELECTROSURGICAL) ×2
ELECTRODE REM PT RTRN 9FT ADLT (ELECTROSURGICAL) ×2 IMPLANT
GAUZE PAD ABD 8X10 STRL (GAUZE/BANDAGES/DRESSINGS) ×10 IMPLANT
GAUZE SPONGE 4X4 12PLY STRL (GAUZE/BANDAGES/DRESSINGS) ×2 IMPLANT
GAUZE SPONGE 4X4 12PLY STRL LF (GAUZE/BANDAGES/DRESSINGS) ×1 IMPLANT
GLOVE BIOGEL PI IND STRL 8 (GLOVE) ×2 IMPLANT
GLOVE SURG SS PI 7.5 STRL IVOR (GLOVE) ×4 IMPLANT
GOWN STRL REUS W/ TWL LRG LVL3 (GOWN DISPOSABLE) ×2 IMPLANT
GUIDEWIRE 1.35MM (WIRE) ×1 IMPLANT
MARKER SKIN DUAL TIP RULER LAB (MISCELLANEOUS) IMPLANT
NDL HYPO 25X1 1.5 SAFETY (NEEDLE) IMPLANT
NEEDLE HYPO 25X1 1.5 SAFETY (NEEDLE) IMPLANT
NS IRRIG 1000ML POUR BTL (IV SOLUTION) ×1 IMPLANT
PACK BASIN DAY SURGERY FS (CUSTOM PROCEDURE TRAY) ×2 IMPLANT
PADDING CAST ABS COTTON 4X4 ST (CAST SUPPLIES) IMPLANT
PADDING CAST SYNTHETIC 4X4 STR (CAST SUPPLIES) ×7 IMPLANT
PADDING CAST SYNTHETIC 6X4 NS (CAST SUPPLIES) ×6 IMPLANT
PENCIL SMOKE EVACUATOR (MISCELLANEOUS) ×1 IMPLANT
PLATE LOCK 61 MED HOOK 3H (Plate) ×1 IMPLANT
SCREW CORT LP 3X38 (Screw) ×1 IMPLANT
SCREW LOCKING 3.5X22 (Screw) ×1 IMPLANT
SCREW VA KREULOCK 3X38 (Screw) ×1 IMPLANT
SLEEVE SCD COMPRESS KNEE MED (STOCKING) ×2 IMPLANT
SOL PREP POV-IOD 4OZ 10% (MISCELLANEOUS) ×1 IMPLANT
SPIKE FLUID TRANSFER (MISCELLANEOUS) IMPLANT
SPLINT PLASTER CAST FAST 5X30 (CAST SUPPLIES) ×40 IMPLANT
SPONGE T-LAP 18X18 ~~LOC~~+RFID (SPONGE) ×1 IMPLANT
STOCKINETTE 6 STRL (DRAPES) ×2 IMPLANT
SUCTION TUBE FRAZIER 10FR DISP (SUCTIONS) IMPLANT
SUT ETHILON 2 0 FS 18 (SUTURE) ×2 IMPLANT
SUT MNCRL AB 3-0 PS2 18 (SUTURE) IMPLANT
SUT VIC AB 0 SH 27 (SUTURE) IMPLANT
SUT VIC AB 2-0 SH 27XBRD (SUTURE) IMPLANT
SUT VIC AB 3-0 SH 27X BRD (SUTURE) IMPLANT
SYR BULB EAR ULCER 3OZ GRN STR (SYRINGE) ×2 IMPLANT
SYR CONTROL 10ML LL (SYRINGE) IMPLANT
TOWEL OR 17X24 6PK STRL BLUE (TOWEL DISPOSABLE) ×4 IMPLANT
TUBE CONNECTING 12X1/4 (SUCTIONS) ×2 IMPLANT
UNDERPAD 30X36 HEAVY ABSORB (UNDERPADS AND DIAPERS) ×2 IMPLANT
WATER STERILE IRR 500ML POUR (IV SOLUTION) ×1 IMPLANT

## 2023-12-01 NOTE — Discharge Instr - Supplementary Instructions (Signed)
May take after 3pm if needed for discomfort.

## 2023-12-01 NOTE — Progress Notes (Signed)
Assisted Dr. Okey Dupre with right, popliteal and saphenous, ultrasound guided block. Side rails up, monitors on throughout procedure. See vital signs in flow sheet. Tolerated Procedure well.

## 2023-12-01 NOTE — Anesthesia Postprocedure Evaluation (Signed)
Anesthesia Post Note  Patient: Meagan Mason  Procedure(s) Performed: OPEN REDUCTION INTERNAL FIXATION (ORIF) PILON ANKLE FRACTURE WITHOUT INTERNAL FIXATION OF FIBULA (Right: Ankle)     Patient location during evaluation: PACU Anesthesia Type: General Level of consciousness: awake and alert Pain management: pain level controlled Vital Signs Assessment: post-procedure vital signs reviewed and stable Respiratory status: spontaneous breathing, nonlabored ventilation, respiratory function stable and patient connected to nasal cannula oxygen Cardiovascular status: blood pressure returned to baseline and stable Postop Assessment: no apparent nausea or vomiting Anesthetic complications: no  No notable events documented.  Last Vitals:  Vitals:   12/01/23 1005 12/01/23 1246  BP: 120/75 101/72  Pulse: 78 80  Resp: (!) 8 11  Temp:  36.6 C  SpO2: 100% 100%    Last Pain:  Vitals:   12/01/23 0841  TempSrc: Oral  PainSc: 0-No pain                 Keny Donald S

## 2023-12-01 NOTE — H&P (Signed)

## 2023-12-01 NOTE — Anesthesia Procedure Notes (Signed)
Anesthesia Regional Block: Popliteal block   Pre-Anesthetic Checklist: , timeout performed,  Correct Patient, Correct Site, Correct Laterality,  Correct Procedure, Correct Position, site marked,  Risks and benefits discussed,  Surgical consent,  Pre-op evaluation,  At surgeon's request and post-op pain management  Laterality: Right  Prep: chloraprep       Needles:  Injection technique: Single-shot  Needle Type: Echogenic Needle     Needle Length: 9cm      Additional Needles:   Procedures:,,,, ultrasound used (permanent image in chart),,    Narrative:  Start time: 12/01/2023 9:54 AM End time: 12/01/2023 10:01 AM Injection made incrementally with aspirations every 5 mL.  Performed by: Personally  Anesthesiologist: Eilene Ghazi, MD  Additional Notes: Patient tolerated the procedure well without complications

## 2023-12-01 NOTE — Anesthesia Procedure Notes (Signed)
Anesthesia Procedure Image    

## 2023-12-01 NOTE — Anesthesia Procedure Notes (Signed)
Anesthesia Regional Block: Adductor canal block   Pre-Anesthetic Checklist: , timeout performed,  Correct Patient, Correct Site, Correct Laterality,  Correct Procedure, Correct Position, site marked,  Risks and benefits discussed,  Surgical consent,  Pre-op evaluation,  At surgeon's request and post-op pain management  Laterality: Right  Prep: chloraprep       Needles:  Injection technique: Single-shot  Needle Type: Echogenic Needle     Needle Length: 9cm      Additional Needles:   Procedures:,,,, ultrasound used (permanent image in chart),,    Narrative:  Start time: 12/01/2023 10:02 AM End time: 12/01/2023 10:07 AM Injection made incrementally with aspirations every 5 mL.  Performed by: Personally  Anesthesiologist: Eilene Ghazi, MD  Additional Notes: Patient tolerated the procedure well without complications

## 2023-12-01 NOTE — Op Note (Signed)
12/01/2023  12:36 PM   PATIENT: Meagan Mason  42 y.o. female  MRN: [id number]   PRE-OPERATIVE DIAGNOSIS:   Closed comminuted displaced intra-articular fracture of right distal tibia plafond (pilon)   POST-OPERATIVE DIAGNOSIS:   Closed comminuted displaced intra-articular fracture of right distal tibia plafond (pilon)   PROCEDURE: ORIF right distal tibia plafond/pilon without internal fixation of fibula   SURGEON:  Netta Cedars, MD   ASSISTANT: None   ANESTHESIA: General, regional   EBL: Minimal   TOURNIQUET:   35 min   COMPLICATIONS: None apparent   DISPOSITION: Extubated, awake and stable to recovery.   INDICATION FOR PROCEDURE: The patient presented with above diagnosis.  We discussed the diagnosis, alternative treatment options, risks and benefits of the above surgical intervention, as well as alternative non-operative treatments. All questions/concerns were addressed and the patient/family demonstrated appropriate understanding of the diagnosis, the procedure, the postoperative course, and overall prognosis. The patient wished to proceed with surgical intervention and signed an informed surgical consent as such, in each others presence prior to surgery.   PROCEDURE IN DETAIL: After preoperative consent was obtained and the correct operative site was identified, the patient was brought to the operating room supine on stretcher and transferred onto operating table. General anesthesia was induced. Preoperative antibiotics were administered. Surgical timeout was taken. The patient was then positioned supine with an ipsilateral hip bump. The operative lower extremity was prepped and draped in standard sterile fashion with a tourniquet around the thigh. The extremity was exsanguinated and the tourniquet was inflated to 275 mmHg.  We then made a direct medial ankle approach and extended this proximally in anticipation of implanting a hook plate. Dissection was  carried down to the level of the pilon fragments. A dental pick and freer elevator were used to reduce the medial malleolar fragment. Of note, there was extensive comminution of this fragment into multiple segments, all of which had very poor bone quality. An Arthrex medial distal tibia hook plate was utilized to fix the reduced medial malleolar fragment and the tines were carefully inserted into the distal tip of the malleolus. The plate was oriented to best capture the major fragments of the medial comminution. We placed a locking screw in the hook plate and subsequently implanted another locking screw proximally to further secure the plate.  The surgical sites were thoroughly irrigated. The tourniquet was deflated and hemostasis achieved. Betadine and vancomycin powder were applied. The deep layers were closed using 2-0 vicryl. The skin was closed without tension.    The leg was cleaned with saline and sterile dressings with gauze were applied. A well padded bulky short leg splint was applied. The patient was awakened from anesthesia and transported to the recovery room in stable condition.    FOLLOW UP PLAN: -transfer to PACU, then home -strict NWB operative extremity, maximum elevation -maintain short leg splint until follow up -smoking cessation counseling -DVT ppx: Aspirin 81 mg twice daily while NWB -follow up as outpatient within 7-10 days for wound check with exchange of short leg splint to short leg cast -sutures out in 2-3 weeks in outpatient office   RADIOGRAPHS: AP, lateral, oblique and stress radiographs of the right ankle were obtained intraoperatively. These showed interval reduction and fixation of the fractures. Manual stress radiographs were taken and the joints were noted to be stable following fixation. All hardware is appropriately positioned and of the appropriate lengths. No other acute injuries are noted.   Netta Cedars Orthopaedic Surgery [facility]

## 2023-12-01 NOTE — Anesthesia Procedure Notes (Signed)
Procedure Name: LMA Insertion Date/Time: 12/01/2023 11:33 AM  Performed by: Earmon Phoenix, CRNAPre-anesthesia Checklist: Patient identified, Emergency Drugs available, Suction available, Patient being monitored and Timeout performed Patient Re-evaluated:Patient Re-evaluated prior to induction Oxygen Delivery Method: Circle system utilized Preoxygenation: Pre-oxygenation with 100% oxygen Induction Type: IV induction Ventilation: Mask ventilation without difficulty LMA: LMA inserted LMA Size: 4.0 Number of attempts: 1 Placement Confirmation: breath sounds checked- equal and bilateral Tube secured with: Tape Dental Injury: Teeth and Oropharynx as per pre-operative assessment

## 2023-12-01 NOTE — Anesthesia Preprocedure Evaluation (Signed)
Anesthesia Evaluation  Patient identified by MRN, date of birth, ID band Patient awake    Reviewed: Allergy & Precautions, H&P , NPO status , Patient's Chart, lab work & pertinent test results  Airway Mallampati: II  TM Distance: >3 FB Neck ROM: Full    Dental no notable dental hx.    Pulmonary neg pulmonary ROS, Current Smoker   Pulmonary exam normal breath sounds clear to auscultation       Cardiovascular hypertension, Normal cardiovascular exam Rhythm:Regular Rate:Normal     Neuro/Psych negative neurological ROS  negative psych ROS   GI/Hepatic Neg liver ROS,GERD  Medicated,,  Endo/Other  negative endocrine ROS    Renal/GU negative Renal ROS  negative genitourinary   Musculoskeletal negative musculoskeletal ROS (+)    Abdominal   Peds negative pediatric ROS (+)  Hematology negative hematology ROS (+)   Anesthesia Other Findings   Reproductive/Obstetrics negative OB ROS                             Anesthesia Physical Anesthesia Plan  ASA: 2  Anesthesia Plan: General   Post-op Pain Management: Regional block* and Tylenol PO (pre-op)*   Induction: Intravenous  PONV Risk Score and Plan: 2 and Ondansetron, Dexamethasone and Treatment may vary due to age or medical condition  Airway Management Planned: LMA  Additional Equipment:   Intra-op Plan:   Post-operative Plan: Extubation in OR  Informed Consent: I have reviewed the patients History and Physical, chart, labs and discussed the procedure including the risks, benefits and alternatives for the proposed anesthesia with the patient or authorized representative who has indicated his/her understanding and acceptance.     Dental advisory given  Plan Discussed with: CRNA and Surgeon  Anesthesia Plan Comments:        Anesthesia Quick Evaluation

## 2023-12-01 NOTE — Transfer of Care (Signed)
Immediate Anesthesia Transfer of Care Note  Patient: Meagan Mason  Procedure(s) Performed: OPEN REDUCTION INTERNAL FIXATION (ORIF) MEDIAL MALLEOLUS ANKLE FRACTURE (Right: Ankle)  Patient Location: PACU  Anesthesia Type:General  Level of Consciousness: awake, alert , oriented, and patient cooperative  Airway & Oxygen Therapy: Patient Spontanous Breathing and Patient connected to nasal cannula oxygen  Post-op Assessment: Report given to RN and Post -op Vital signs reviewed and stable  Post vital signs: Reviewed and stable  Last Vitals:  Vitals Value Taken Time  BP 124/84 12/01/23 1238  Temp    Pulse 82 12/01/23 1243  Resp 10 12/01/23 1243  SpO2 100 % 12/01/23 1243  Vitals shown include unfiled device data.  Last Pain:  Vitals:   12/01/23 0841  TempSrc: Oral  PainSc: 0-No pain      Patients Stated Pain Goal: 5 (12/01/23 0841)  Complications: No notable events documented.

## 2023-12-05 ENCOUNTER — Encounter (HOSPITAL_BASED_OUTPATIENT_CLINIC_OR_DEPARTMENT_OTHER): Payer: Self-pay | Admitting: Orthopaedic Surgery

## 2024-02-23 ENCOUNTER — Other Ambulatory Visit: Payer: Self-pay | Admitting: Family Medicine

## 2024-02-23 DIAGNOSIS — K2970 Gastritis, unspecified, without bleeding: Secondary | ICD-10-CM

## 2024-05-29 ENCOUNTER — Emergency Department (HOSPITAL_COMMUNITY)
Admission: EM | Admit: 2024-05-29 | Discharge: 2024-05-29 | Disposition: A | Discharge status: Home / Self Care | Attending: Emergency Medicine | Admitting: Emergency Medicine

## 2024-05-29 ENCOUNTER — Encounter (HOSPITAL_COMMUNITY): Payer: Self-pay | Admitting: Emergency Medicine

## 2024-05-29 DIAGNOSIS — T63461A Toxic effect of venom of wasps, accidental (unintentional), initial encounter: Secondary | ICD-10-CM | POA: Insufficient documentation

## 2024-05-29 DIAGNOSIS — T7840XA Allergy, unspecified, initial encounter: Secondary | ICD-10-CM

## 2024-05-29 MED ORDER — ACETAMINOPHEN 500 MG PO TABS
1000.0000 mg | ORAL_TABLET | Freq: Once | ORAL | Status: AC
  Administered 2024-05-29: 1000 mg via ORAL
  Filled 2024-05-29: qty 2

## 2024-05-29 MED ORDER — EPINEPHRINE 0.3 MG/0.3ML IJ SOAJ
0.3000 mg | INTRAMUSCULAR | 0 refills | Status: AC | PRN
Start: 2024-05-29 — End: ?

## 2024-05-29 NOTE — Discharge Instructions (Addendum)
 Make sure that you carry your EpiPen  with you going forward.  If you get stung by another bee, you may need to use it.  If you feel like you are having trouble with your breathing, use your EpiPen , and come to the emergency room.  Please follow-up with your primary care doctor.

## 2024-05-29 NOTE — ED Notes (Signed)
 Hives look better but at bite site patient complains of pain as well as generalized pain thinks it is from shaking.

## 2024-05-29 NOTE — ED Triage Notes (Signed)
 Pt bib [facility] after going to urgent care. Patient was stung 2 times by wasp/hornet. Urgent care gave 8mg  IM decadron  and EMS gave 50mg  benadryl  and 125mg  solumedrol IV.  Hives noted and skin red.

## 2024-05-29 NOTE — ED Provider Notes (Signed)
 [place] EMERGENCY DEPARTMENT AT [facility] Provider Note   CSN: [id number] Arrival date & time: 05/29/24  1339     Patient presents with: Allergic Reaction   Meagan Mason is a 43 y.o. female.   Otherwise healthy 43 year old female here today after she was stung twice by a wasp.  She went to an urgent care provided patient with Decadron .  EMS gave the patient Solu-Medrol  and Benadryl .  Patient reports that she been stung by bees many times, but when she was stung about 1 year ago she did have a mild reaction.  She is not have any difficulty with her breathing.   Allergic Reaction      Prior to Admission medications   Medication Sig Start Date End Date Taking? Authorizing Provider  EPINEPHrine  0.3 mg/0.3 mL IJ SOAJ injection Inject 0.3 mg into the muscle as needed for anaphylaxis. 05/29/24  Yes Mannie Pac T, DO  ALBUTEROL IN Inhale into the lungs.    [provider]  ibuprofen  (ADVIL ) 800 MG tablet Take 1 tablet (800 mg total) by mouth 3 (three) times daily. 11/22/23   Leath-Warren, Etta PARAS, NP  LORazepam  (ATIVAN ) 0.5 MG tablet Take 0.5 mg by mouth every 8 (eight) hours. 1/2 - 1 tablet as needed    [provider]  Misc. Devices (CRUTCHES-ALUMINUM ) MISC 1 each by Does not apply route daily. 11/22/23   Leath-Warren, Etta PARAS, NP  omeprazole  (PRILOSEC) 20 MG capsule Take 20 mg by mouth as needed.    [provider]  ondansetron  (ZOFRAN  ODT) 4 MG disintegrating tablet Take 1 tablet (4 mg total) by mouth every 8 (eight) hours as needed for nausea or vomiting. Patient taking differently: Take 8 mg by mouth every 8 (eight) hours as needed for nausea or vomiting. 08/24/22   Ozell Heron HERO, MD  promethazine  (PHENERGAN ) 25 MG suppository Place 1 suppository (25 mg total) rectally every 8 (eight) hours as needed for nausea or vomiting. 08/24/22   Ozell Heron HERO, MD  CARAFATE  1 GM/10ML suspension Take 10 mLs by mouth 4 (four) times daily.  07/05/19 10/22/19  [provider]  metoCLOPramide  (REGLAN ) 10 MG tablet Take 1 tablet (10 mg total) by mouth every 6 (six) hours as needed for nausea or vomiting. 07/06/19 10/22/19  Carlyle Lenis, MD    Allergies: Other, Poison ivy extract, and Benadryl  [diphenhydramine ]    Review of Systems  Updated Vital Signs BP (!) 152/96   Pulse (!) 126   Temp 98 F (36.7 C)   Resp 17   Ht 5' 4 (1.626 m)   Wt 65.7 kg   SpO2 97%   BMI 24.86 kg/m   Physical Exam Vitals and nursing note reviewed.  Constitutional:      Appearance: Normal appearance.   Cardiovascular:     Rate and Rhythm: Tachycardia present.  Pulmonary:     Effort: Pulmonary effort is normal.     Comments: No wheezing Abdominal:     General: Abdomen is flat.     Palpations: Abdomen is soft.   Skin:    General: Skin is warm.     Comments: Diffuse urticaria   Neurological:     Mental Status: She is alert.     (all labs ordered are listed, but only abnormal results are displayed) Labs Reviewed - No data to display  EKG: None  Radiology: No results found.   Procedures   Medications Ordered in the ED - No data to display  Medical Decision Making 43 year old female here today after allergic reaction secondary to insect sting.  Plan- on assessment, patient does have some mild swelling, urticaria.  She does not have any respiratory involvement, no nausea, no vomiting.  At this time, do not believe patient requires epinephrine .  Patient was stung at approximately 12:30 PM.  Will continue to monitor here in the emergency room, low threshold for providing patient with epinephrine .  Patient signed out to Dr. Patsey pending re-assessment and disposition.         Final diagnoses:  Allergic reaction, initial encounter    ED Discharge Orders          Ordered    EPINEPHrine  0.3 mg/0.3 mL IJ SOAJ injection  As needed        05/29/24 1500                Mannie Pac T, DO 05/29/24 1500

## 2024-05-29 NOTE — ED Provider Notes (Signed)
  Physical Exam  BP (!) 152/104 (BP Location: Right Arm)   Pulse (!) 110   Temp 98.6 F (37 C)   Resp 17   Ht 5' 4 (1.626 m)   Wt 65.7 kg   SpO2 96%   BMI 24.86 kg/m   Physical Exam  Procedures  Procedures  ED Course / MDM    Medical Decision Making Risk OTC drugs. Prescription drug management.   Received in signout.  Allergic reaction to wasp.  Has been stable in the ER for 3 hours and 20 minutes.  Discharged home with prescription for EpiPen        Patsey Lot, MD 05/29/24 1701

## 2024-06-03 ENCOUNTER — Encounter (HOSPITAL_COMMUNITY): Payer: Self-pay | Admitting: Emergency Medicine

## 2024-06-03 ENCOUNTER — Emergency Department (HOSPITAL_COMMUNITY)
Admission: EM | Admit: 2024-06-03 | Discharge: 2024-06-03 | Disposition: A | Discharge status: Home / Self Care | Attending: Emergency Medicine | Admitting: Emergency Medicine

## 2024-06-03 ENCOUNTER — Other Ambulatory Visit: Payer: Self-pay

## 2024-06-03 DIAGNOSIS — R112 Nausea with vomiting, unspecified: Secondary | ICD-10-CM | POA: Insufficient documentation

## 2024-06-03 DIAGNOSIS — F419 Anxiety disorder, unspecified: Secondary | ICD-10-CM | POA: Diagnosis not present

## 2024-06-03 DIAGNOSIS — D72829 Elevated white blood cell count, unspecified: Secondary | ICD-10-CM | POA: Insufficient documentation

## 2024-06-03 DIAGNOSIS — R258 Other abnormal involuntary movements: Secondary | ICD-10-CM | POA: Diagnosis not present

## 2024-06-03 DIAGNOSIS — R1110 Vomiting, unspecified: Secondary | ICD-10-CM | POA: Diagnosis present

## 2024-06-03 DIAGNOSIS — E860 Dehydration: Secondary | ICD-10-CM | POA: Insufficient documentation

## 2024-06-03 LAB — COMPREHENSIVE METABOLIC PANEL WITH GFR
ALT: 14 U/L (ref 0–44)
AST: 20 U/L (ref 15–41)
Albumin: 4.2 g/dL (ref 3.5–5.0)
Alkaline Phosphatase: 58 U/L (ref 38–126)
Anion gap: 16 — ABNORMAL HIGH (ref 5–15)
BUN: 8 mg/dL (ref 6–20)
CO2: 27 mmol/L (ref 22–32)
Calcium: 9.6 mg/dL (ref 8.9–10.3)
Chloride: 97 mmol/L — ABNORMAL LOW (ref 98–111)
Creatinine, Ser: 0.72 mg/dL (ref 0.44–1.00)
GFR, Estimated: >60 mL/min (ref >60)
Glucose, Bld: 122 mg/dL — ABNORMAL HIGH (ref 70–99)
Potassium: 4.3 mmol/L (ref 3.5–5.1)
Sodium: 140 mmol/L (ref 135–145)
Total Bilirubin: 0.8 mg/dL (ref 0.0–1.2)
Total Protein: 8 g/dL (ref 6.5–8.1)

## 2024-06-03 LAB — URINALYSIS, ROUTINE W REFLEX MICROSCOPIC
Bacteria, UA: NONE SEEN
Bilirubin Urine: NEGATIVE
Glucose, UA: NEGATIVE mg/dL
Hgb urine dipstick: NEGATIVE
Ketones, ur: 20 mg/dL — AB
Nitrite: NEGATIVE
Protein, ur: 100 mg/dL — AB
Specific Gravity, Urine: 1.021 (ref 1.005–1.030)
pH: 8 (ref 5.0–8.0)

## 2024-06-03 LAB — CBC WITH DIFFERENTIAL/PLATELET
Abs Immature Granulocytes: 0.03 10*3/uL (ref 0.00–0.07)
Basophils Absolute: 0.1 10*3/uL (ref 0.0–0.1)
Basophils Relative: 1 %
Eosinophils Absolute: 0 10*3/uL (ref 0.0–0.5)
Eosinophils Relative: 0 %
HCT: 44.5 % (ref 36.0–46.0)
Hemoglobin: 15.2 g/dL — ABNORMAL HIGH (ref 12.0–15.0)
Immature Granulocytes: 0 %
Lymphocytes Relative: 16 %
Lymphs Abs: 1.7 10*3/uL (ref 0.7–4.0)
MCH: 33.8 pg (ref 26.0–34.0)
MCHC: 34.2 g/dL (ref 30.0–36.0)
MCV: 98.9 fL (ref 80.0–100.0)
Monocytes Absolute: 1.1 10*3/uL — ABNORMAL HIGH (ref 0.1–1.0)
Monocytes Relative: 10 %
Neutro Abs: 7.9 10*3/uL — ABNORMAL HIGH (ref 1.7–7.7)
Neutrophils Relative %: 73 %
Platelets: 391 10*3/uL (ref 150–400)
RBC: 4.5 MIL/uL (ref 3.87–5.11)
RDW: 13.7 % (ref 11.5–15.5)
WBC: 10.8 10*3/uL — ABNORMAL HIGH (ref 4.0–10.5)
nRBC: 0 % (ref 0.0–0.2)

## 2024-06-03 LAB — PREGNANCY, URINE: Preg Test, Ur: NEGATIVE

## 2024-06-03 MED ORDER — ONDANSETRON HCL 4 MG/2ML IJ SOLN
4.0000 mg | Freq: Once | INTRAMUSCULAR | Status: AC
  Administered 2024-06-03: 4 mg via INTRAVENOUS
  Filled 2024-06-03: qty 2

## 2024-06-03 MED ORDER — LORAZEPAM 2 MG/ML IJ SOLN
1.0000 mg | Freq: Once | INTRAMUSCULAR | Status: AC
  Administered 2024-06-03: 1 mg via INTRAVENOUS
  Filled 2024-06-03: qty 1

## 2024-06-03 MED ORDER — PANTOPRAZOLE SODIUM 40 MG IV SOLR
40.0000 mg | Freq: Once | INTRAVENOUS | Status: AC
  Administered 2024-06-03: 40 mg via INTRAVENOUS
  Filled 2024-06-03: qty 10

## 2024-06-03 MED ORDER — SODIUM CHLORIDE 0.9 % IV BOLUS
1000.0000 mL | Freq: Once | INTRAVENOUS | Status: AC
  Administered 2024-06-03: 1000 mL via INTRAVENOUS

## 2024-06-03 NOTE — ED Notes (Signed)
Pt ambulatory to restroom with independent steady gait °

## 2024-06-03 NOTE — ED Notes (Signed)
 ..  The patient is A&OX4, ambulatory at d/c with independent steady gait, NAD. Pt verbalized understanding of d/c instructions and follow up care.

## 2024-06-03 NOTE — ED Provider Notes (Signed)
 [place] EMERGENCY DEPARTMENT AT [facility] Provider Note   CSN: [id number] Arrival date & time: 06/03/24  1420     Patient presents with: No chief complaint on file.   Meagan Mason is a 43 y.o. female.  She has a history of cyclic vomiting.  She said she had multiple wasp stings Tuesday and needed to go to the emergency department.  She was shaking and anxious and they gave her steroids and other medications.  She has not felt well since then feeling very nauseous and having retching, very tired and diaphoretic.  She has been using as needed Zofran  with minimal improvement.  She does not think she has had a fever.  No chest pain or abdominal pain.  {Add pertinent medical, surgical, social history, OB history to YEP:67052} The history is provided by the patient.  Emesis Severity:  Moderate Duration:  5 days Timing:  Intermittent Progression:  Unchanged Chronicity:  Recurrent Associated symptoms: no abdominal pain and no cough        Prior to Admission medications   Medication Sig Start Date End Date Taking? Authorizing Provider  ALBUTEROL IN Inhale into the lungs.    [provider]  EPINEPHrine  0.3 mg/0.3 mL IJ SOAJ injection Inject 0.3 mg into the muscle as needed for anaphylaxis. 05/29/24   Mannie Pac T, DO  ibuprofen  (ADVIL ) 800 MG tablet Take 1 tablet (800 mg total) by mouth 3 (three) times daily. 11/22/23   Leath-Warren, Etta PARAS, NP  LORazepam  (ATIVAN ) 0.5 MG tablet Take 0.5 mg by mouth every 8 (eight) hours. 1/2 - 1 tablet as needed    [provider]  Misc. Devices (CRUTCHES-ALUMINUM ) MISC 1 each by Does not apply route daily. 11/22/23   Leath-Warren, Etta PARAS, NP  omeprazole  (PRILOSEC) 20 MG capsule Take 20 mg by mouth as needed.    [provider]  ondansetron  (ZOFRAN  ODT) 4 MG disintegrating tablet Take 1 tablet (4 mg total) by mouth every 8 (eight) hours as needed for nausea or vomiting. Patient taking differently: Take 8  mg by mouth every 8 (eight) hours as needed for nausea or vomiting. 08/24/22   Ozell Heron HERO, MD  promethazine  (PHENERGAN ) 25 MG suppository Place 1 suppository (25 mg total) rectally every 8 (eight) hours as needed for nausea or vomiting. 08/24/22   Ozell Heron HERO, MD  CARAFATE  1 GM/10ML suspension Take 10 mLs by mouth 4 (four) times daily. 07/05/19 10/22/19  [provider]  metoCLOPramide  (REGLAN ) 10 MG tablet Take 1 tablet (10 mg total) by mouth every 6 (six) hours as needed for nausea or vomiting. 07/06/19 10/22/19  Carlyle Lenis, MD    Allergies: Other and Poison ivy extract    Review of Systems  Respiratory:  Negative for cough.   Gastrointestinal:  Positive for vomiting. Negative for abdominal pain.    Updated Vital Signs BP (!) 143/130   Pulse (!) 136   Temp 98.3 F (36.8 C) (Temporal)   Resp 17   Ht 5' 4 (1.626 m)   Wt 68 kg   LMP 05/25/2024 (Approximate)   SpO2 98%   BMI 25.75 kg/m   Physical Exam Vitals and nursing note reviewed.  Constitutional:      General: She is not in acute distress.    Appearance: Normal appearance. She is well-developed.  HENT:     Head: Normocephalic and atraumatic.   Eyes:     Conjunctiva/sclera: Conjunctivae normal.    Cardiovascular:     Rate and  Rhythm: Regular rhythm. Tachycardia present.     Heart sounds: No murmur heard. Pulmonary:     Effort: Pulmonary effort is normal. No respiratory distress.     Breath sounds: Normal breath sounds. No stridor. No wheezing.  Abdominal:     Palpations: Abdomen is soft.     Tenderness: There is no abdominal tenderness. There is no guarding or rebound.   Musculoskeletal:        General: No deformity.     Cervical back: Neck supple.   Skin:    General: Skin is warm and dry.   Neurological:     General: No focal deficit present.     Mental Status: She is alert.     GCS: GCS eye subscore is 4. GCS verbal subscore is 5. GCS motor subscore is 6.     Sensory: No  sensory deficit.     Motor: No weakness.     (all labs ordered are listed, but only abnormal results are displayed) Labs Reviewed  CBC WITH DIFFERENTIAL/PLATELET - Abnormal; Notable for the following components:      Result Value   WBC 10.8 (*)    Hemoglobin 15.2 (*)    Neutro Abs 7.9 (*)    Monocytes Absolute 1.1 (*)    All other components within normal limits  COMPREHENSIVE METABOLIC PANEL WITH GFR - Abnormal; Notable for the following components:   Chloride 97 (*)    Glucose, Bld 122 (*)    Anion gap 16 (*)    All other components within normal limits  URINALYSIS, ROUTINE W REFLEX MICROSCOPIC    EKG: None  Radiology: No results found.  {Document cardiac monitor, telemetry assessment procedure when appropriate:32947} Procedures   Medications Ordered in the ED  sodium chloride  0.9 % bolus 1,000 mL (has no administration in time range)  ondansetron  (ZOFRAN ) injection 4 mg (has no administration in time range)  pantoprazole  (PROTONIX ) injection 40 mg (has no administration in time range)      {Click here for ABCD2, HEART and other calculators REFRESH Note before signing:1}                              Medical Decision Making Amount and/or Complexity of Data Reviewed Labs: ordered.  Risk Prescription drug management.   This patient complains of ***; this involves an extensive number of treatment Options and is a complaint that carries with it a high risk of complications and morbidity. The differential includes ***  I ordered, reviewed and interpreted labs, which included *** I ordered medication *** and reviewed PMP when indicated. I ordered imaging studies which included *** and I independently    visualized and interpreted imaging which showed *** Additional history obtained from *** Previous records obtained and reviewed *** I consulted *** and discussed lab and imaging findings and discussed disposition.  Cardiac monitoring reviewed, *** Social  determinants considered, *** Critical Interventions: ***  After the interventions stated above, I reevaluated the patient and found *** Admission and further testing considered, ***   {Document critical care time when appropriate  Document review of labs and clinical decision tools ie CHADS2VASC2, etc  Document your independent review of radiology images and any outside records  Document your discussion with family members, caretakers and with consultants  Document social determinants of health affecting pt's care  Document your decision making why or why not admission, treatments were needed:32947:::1}   Final diagnoses:  None  ED Discharge Orders     None

## 2024-06-03 NOTE — ED Triage Notes (Signed)
 Pt was stung by a bunch of wasps on Tuesday and went to UC/ER afterwards, discharged with meds for hives. She has not been feeling well since the event. She went to an IV spa on Friday for IV fluids and zofran  with minimal relief of discomfort. Today pt reports continued bouts of nausea, sweating, and fatigue.

## 2024-06-06 ENCOUNTER — Ambulatory Visit: Admitting: Family Medicine

## 2024-06-11 ENCOUNTER — Ambulatory Visit (INDEPENDENT_AMBULATORY_CARE_PROVIDER_SITE_OTHER): Admitting: Family Medicine

## 2024-06-11 ENCOUNTER — Encounter: Payer: Self-pay | Admitting: Family Medicine

## 2024-06-11 VITALS — BP 150/90 | HR 95 | Temp 98.9°F | Ht 64.0 in | Wt 144.2 lb

## 2024-06-11 DIAGNOSIS — K219 Gastro-esophageal reflux disease without esophagitis: Secondary | ICD-10-CM | POA: Diagnosis not present

## 2024-06-11 DIAGNOSIS — F411 Generalized anxiety disorder: Secondary | ICD-10-CM

## 2024-06-11 DIAGNOSIS — T63461A Toxic effect of venom of wasps, accidental (unintentional), initial encounter: Secondary | ICD-10-CM | POA: Insufficient documentation

## 2024-06-11 DIAGNOSIS — I10 Essential (primary) hypertension: Secondary | ICD-10-CM

## 2024-06-11 DIAGNOSIS — R1115 Cyclical vomiting syndrome unrelated to migraine: Secondary | ICD-10-CM

## 2024-06-11 DIAGNOSIS — F410 Panic disorder [episodic paroxysmal anxiety] without agoraphobia: Secondary | ICD-10-CM

## 2024-06-11 DIAGNOSIS — J4540 Moderate persistent asthma, uncomplicated: Secondary | ICD-10-CM

## 2024-06-11 MED ORDER — BUDESONIDE-FORMOTEROL FUMARATE 160-4.5 MCG/ACT IN AERO: 2.0000 | INHALATION_SPRAY | Freq: Two times a day (BID) | RESPIRATORY_TRACT | 3 refills | Status: AC

## 2024-06-11 MED ORDER — ONDANSETRON 4 MG PO TBDP: 8.0000 mg | ORAL_TABLET | Freq: Three times a day (TID) | ORAL | 2 refills | Status: DC | PRN

## 2024-06-11 MED ORDER — OMEPRAZOLE 40 MG PO CPDR: 40.0000 mg | DELAYED_RELEASE_CAPSULE | Freq: Every day | ORAL | 3 refills | Status: AC

## 2024-06-11 MED ORDER — HYDROXYZINE PAMOATE 25 MG PO CAPS: 25.0000 mg | ORAL_CAPSULE | Freq: Three times a day (TID) | ORAL | 2 refills | Status: AC | PRN

## 2024-06-11 MED ORDER — FLUOXETINE HCL 40 MG PO CAPS: 40.0000 mg | ORAL_CAPSULE | Freq: Every day | ORAL | 3 refills | Status: DC

## 2024-06-11 MED ORDER — LISINOPRIL 20 MG PO TABS: 20.0000 mg | ORAL_TABLET | Freq: Every day | ORAL | 3 refills | Status: AC

## 2024-06-11 NOTE — Progress Notes (Unsigned)
 Established Patient Office Visit  Subjective   Patient ID: Meagan Mason, female    DOB: Aug 12, 1981  Age: 43 y.o. 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 She was given an epipen  and she was asking about how to use the epipen . I counseled her on this.  I reviewed the ER notes, pt reports she didn't think that she had any breathing problems, but her HR was elevated and she was noted to have diffuse urticaria.   Anxiety/depression-- pt states she is off her medication currently, has been reported in the past to be noncompliant with her medication, states she would like to go back on her medication. States that she needs something for her acute anxiety attacks. States that the lorazapam worked for her in the past but no one will give it to her , we discussed the risks of long term benzo use and I advised we use hydroxyzine  PRN instead.   HTN-- BP is also elevated today, she seems somewhat anxious in the visit, mother is here with her also today. Pt reports she has been off of her medication, formerly on chlorthalidone  pt states she didn't pick it up because it was not covered on her insurance., we discussed changing to lisinopril  and she is agreeable to try this medication  GERD/ cyclical vomiting-- pt states she continues to have vomiting episodes, is using ondansetron  8 mg as needed for these episodes, states she still isn't sure why she is having these episodes, states she has seen GI in the past and had work up. States that the 40 mg dose of PPI works better for her and would like her dose increased back to that.    Smoking-- pt continues to smoke and have a chronic cough, no recent viral illnesses for flare ups. She is wheezing on exam today    Current Outpatient Medications  Medication Instructions   ALBUTEROL IN Inhale into the lungs.   budesonide -formoterol  (SYMBICORT ) 160-4.5 MCG/ACT inhaler 2 puffs, Inhalation, 2 times daily   EPINEPHrine  (EPI-PEN) 0.3 mg, Intramuscular, As needed   FLUoxetine  (PROZAC ) 40 mg, Oral, Daily   hydrOXYzine  (VISTARIL ) 25 mg, Oral, Every 8 hours PRN   lisinopril  (ZESTRIL ) 20 mg, Oral, Daily   LORazepam  (ATIVAN ) 0.5 mg, Every 8 hours   Misc. 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     Objective:     BP (!) 150/90   Pulse 95   Temp 98.9 F (37.2 C) (Oral)   Ht 5' 4 (1.626 m)   Wt 144 lb 3.2 oz (65.4 kg)   LMP 05/25/2024 (Approximate)   SpO2 96%   BMI 24.75 kg/m    Physical Exam Vitals reviewed.  Constitutional:      Appearance: Normal appearance. She is well-groomed and normal weight.  Cardiovascular:     Rate and Rhythm: Normal  rate and regular rhythm.     Heart sounds: S1 normal and S2 normal.  Pulmonary:     Effort: Pulmonary effort is normal.     Breath sounds: Normal air entry. Wheezing (end expiratory) present.  Musculoskeletal:     Right lower leg: No edema.     Left lower leg: No edema.  Neurological:     Mental Status: She is alert and oriented to person, place, and time. Mental status is at baseline.     Gait: Gait is intact.  Psychiatric:        Mood and Affect: Mood and affect normal.        Speech: Speech normal.        Behavior: Behavior normal.        Judgment: Judgment normal.      No results found for any visits on 06/11/24.    The ASCVD Risk score (Arnett DK, et al., 2019) failed to calculate for the following reasons:   The valid HDL cholesterol range is 20 to 100 mg/dL    Assessment & Plan:  Allergic reaction to wasp sting Assessment & Plan: I counseled the patient on prevention with use of bug sprays, premedicate with antihistamines before working outside. And to have her epipen  with her at all times.    Essential hypertension Assessment & Plan: Wiill start lisinopril  daily for BP control and see her back in 1 month for BP recheck  Orders: -     Lisinopril ; Take 1 tablet (20 mg total) by mouth daily.  Dispense: 30 tablet; Refill: 3  Gastroesophageal reflux disease without esophagitis -     Omeprazole ; Take 1 capsule (40 mg total) by mouth daily.  Dispense: 30 capsule; Refill: 3  Generalized anxiety disorder with panic attacks Assessment & Plan: Will place her back on the 40 mg daily of fluoxetine , I had previously recommended referral to psychiatry however pt did not go. Pt is asking for something to take PRN for acute anxiety, I recommended she use hydroxyzine  25 mg every 6 hours PRN for anxiety, I did not advise continuing benzodiazepines for regular use as these are high risk medications with a risk of dependency and withdrawal.  Orders: -     FLUoxetine  HCl; Take 1  capsule (40 mg total) by mouth daily.  Dispense: 30 capsule; Refill: 3 -     hydrOXYzine  Pamoate; Take 1 capsule (25 mg total) by mouth every 8 (eight) hours as needed.  Dispense: 30 capsule; Refill: 2  Cyclical vomiting Assessment & Plan: The differential diagnosis list for this dx is extensive, it is not clear if patient continues to smoke marijuana, the specialist that she saw seemed to think that it was related to her marijuana use and advised that she stop this habit. I did explain  that this was the most likely cause to the patient.   Orders: -     Ondansetron ; Take 2 tablets (8 mg total) by mouth every 8 (eight) hours as needed for nausea or vomiting.  Dispense: 60 tablet; Refill: 2  Moderate persistent asthma without complication -     Budesonide -Formoterol  Fumarate; Inhale 2 puffs into the lungs 2 (two) times daily.  Dispense: 1 each; Refill: 3     Return in about 1 month (around 07/12/2024) for HTN follow up and will need bloodwork. , annual physical exam.    Heron CHRISTELLA Sharper, MD

## 2024-06-13 NOTE — Assessment & Plan Note (Signed)
 Wiill start lisinopril  daily for BP control and see her back in 1 month for BP recheck

## 2024-06-13 NOTE — Assessment & Plan Note (Signed)
 The differential diagnosis list for this dx is extensive, it is not clear if patient continues to smoke marijuana, the specialist that she saw seemed to think that it was related to her marijuana use and advised that she stop this habit. I did explain that this was the most likely cause to the patient.

## 2024-06-13 NOTE — Assessment & Plan Note (Signed)
 I counseled the patient on prevention with use of bug sprays, premedicate with antihistamines before working outside. And to have her epipen  with her at all times.

## 2024-06-13 NOTE — Assessment & Plan Note (Signed)
 Will place her back on the 40 mg daily of fluoxetine , I had previously recommended referral to psychiatry however pt did not go. Pt is asking for something to take PRN for acute anxiety, I recommended she use hydroxyzine  25 mg every 6 hours PRN for anxiety, I did not advise continuing benzodiazepines for regular use as these are high risk medications with a risk of dependency and withdrawal.

## 2024-07-04 ENCOUNTER — Other Ambulatory Visit: Payer: Self-pay | Admitting: Family Medicine

## 2024-07-04 DIAGNOSIS — F410 Panic disorder [episodic paroxysmal anxiety] without agoraphobia: Secondary | ICD-10-CM

## 2024-07-20 ENCOUNTER — Ambulatory Visit: Admitting: Family Medicine

## 2024-09-26 ENCOUNTER — Telehealth: Admitting: Family Medicine

## 2024-09-26 DIAGNOSIS — L237 Allergic contact dermatitis due to plants, except food: Secondary | ICD-10-CM

## 2024-09-26 MED ORDER — PREDNISONE 10 MG PO TABS: ORAL_TABLET | ORAL | 0 refills | Status: AC

## 2024-09-26 NOTE — Progress Notes (Signed)
 E Visit for Rash  We are sorry that you are not feeling well. Here is how we plan to help!  Based on what you shared with me it looks like you have contact dermatitis.  Contact dermatitis is a skin rash caused by something that touches the skin and causes irritation or inflammation.  Your skin may be red, swollen, dry, cracked, and itch.  The rash should go away in a few days but can last a few weeks.  If you get a rash, it's important to figure out what caused it so the irritant can be avoided in the future. and I am prescribing a two week course of steroids (37 tablets of 10 mg prednisone ).  Days 1-4 take 4 tablets (40 mg) daily  Days 5-8 take 3 tablets (30 mg) daily, Days 9-11 take 2 tablets (20 mg) daily, Days 12-14 take 1 tablet (10 mg) daily.    HOME CARE:  Take cool showers and avoid direct sunlight. Apply cool compress or wet dressings. Take a bath in an oatmeal bath.  Sprinkle content of one Aveeno packet under running faucet with comfortably warm water .  Bathe for 15-20 minutes, 1-2 times daily.  Pat dry with a towel. Do not rub the rash. Use hydrocortisone cream. Take an antihistamine like Benadryl  for widespread rashes that itch.  The adult dose of Benadryl  is 25-50 mg by mouth 4 times daily. Caution:  This type of medication may cause sleepiness.  Do not drink alcohol, drive, or operate dangerous machinery while taking antihistamines.  Do not take these medications if you have prostate enlargement.  Read package instructions thoroughly on all medications that you take.  GET HELP RIGHT AWAY IF:  Symptoms don't go away after treatment. Severe itching that persists. If you rash spreads or swells. If you rash begins to smell. If it blisters and opens or develops a yellow-brown crust. You develop a fever. You have a sore throat. You become short of breath.  MAKE SURE YOU:  Understand these instructions. Will watch your condition. Will get help right away if you are not doing  well or get worse.  Thank you for choosing an e-visit. Your e-visit answers were reviewed by a board certified advanced clinical practitioner to complete your personal care plan. Depending upon the condition, your plan could have included both over the counter or prescription medications. Please review your pharmacy choice. Be sure that the pharmacy you have chosen is open so that you can pick up your prescription now.  If there is a problem you may message your provider in MyChart to have the prescription routed to another pharmacy. Your safety is important to us . If you have drug allergies check your prescription carefully.  For the next 24 hours, you can use MyChart to ask questions about today's visit, request a non-urgent call back, or ask for a work or school excuse from your e-visit provider. You will get an email in the next two days asking about your experience. I hope that your e-visit has been valuable and will speed your recovery.  I have spent 5 minutes in review of e-visit questionnaire, review and updating patient chart, medical decision making and response to patient.   Elsie Velma Lunger, PA-C

## 2024-11-30 ENCOUNTER — Other Ambulatory Visit: Payer: Self-pay | Admitting: Family Medicine

## 2024-11-30 DIAGNOSIS — R1115 Cyclical vomiting syndrome unrelated to migraine: Secondary | ICD-10-CM

## 2024-12-15 ENCOUNTER — Telehealth: Admitting: Nurse Practitioner

## 2024-12-15 ENCOUNTER — Encounter

## 2024-12-15 ENCOUNTER — Other Ambulatory Visit: Payer: Self-pay | Admitting: Nurse Practitioner

## 2024-12-15 DIAGNOSIS — J069 Acute upper respiratory infection, unspecified: Secondary | ICD-10-CM | POA: Diagnosis not present

## 2024-12-15 DIAGNOSIS — H6993 Unspecified Eustachian tube disorder, bilateral: Secondary | ICD-10-CM

## 2024-12-15 MED ORDER — OFLOXACIN 0.3 % OT SOLN: 5.0000 [drp] | Freq: Every day | OTIC | 0 refills | Status: AC

## 2024-12-15 MED ORDER — IPRATROPIUM BROMIDE 0.03 % NA SOLN: 2.0000 | Freq: Two times a day (BID) | NASAL | 0 refills | Status: AC

## 2024-12-15 MED ORDER — PROMETHAZINE-DM 6.25-15 MG/5ML PO SYRP: 5.0000 mL | ORAL_SOLUTION | Freq: Four times a day (QID) | ORAL | 0 refills | Status: AC | PRN

## 2024-12-15 NOTE — Progress Notes (Signed)

## 2024-12-15 NOTE — Progress Notes (Signed)
 Duplicate visit

## 2025-01-06 ENCOUNTER — Other Ambulatory Visit: Payer: Self-pay | Admitting: Nurse Practitioner

## 2025-01-06 DIAGNOSIS — J069 Acute upper respiratory infection, unspecified: Secondary | ICD-10-CM
